# Patient Record
Sex: Female | Born: 1940 | Race: White | Hispanic: No | Smoking: Never smoker
Health system: Southern US, Community
[De-identification: ages and names within clinical notes are randomized; demographics above are authoritative.]

## PROBLEM LIST (undated history)

## (undated) DIAGNOSIS — M810 Age-related osteoporosis without current pathological fracture: Secondary | ICD-10-CM

## (undated) DIAGNOSIS — M1990 Unspecified osteoarthritis, unspecified site: Secondary | ICD-10-CM

## (undated) DIAGNOSIS — F0390 Unspecified dementia without behavioral disturbance: Secondary | ICD-10-CM

## (undated) DIAGNOSIS — I10 Essential (primary) hypertension: Secondary | ICD-10-CM

## (undated) DIAGNOSIS — D509 Iron deficiency anemia, unspecified: Secondary | ICD-10-CM

## (undated) HISTORY — PX: KNEE SURGERY: SHX244

---

## 1999-07-12 HISTORY — PX: OTHER SURGICAL HISTORY: SHX169

## 2003-11-15 ENCOUNTER — Ambulatory Visit: Payer: Self-pay | Admitting: Family Medicine

## 2004-07-17 ENCOUNTER — Ambulatory Visit: Payer: Self-pay | Admitting: General Practice

## 2004-07-17 HISTORY — PX: OTHER SURGICAL HISTORY: SHX169

## 2005-02-18 ENCOUNTER — Ambulatory Visit: Payer: Self-pay | Admitting: Family Medicine

## 2005-08-13 ENCOUNTER — Ambulatory Visit: Payer: Self-pay | Admitting: Family Medicine

## 2006-05-01 ENCOUNTER — Ambulatory Visit: Payer: Self-pay | Admitting: Family Medicine

## 2007-05-21 ENCOUNTER — Ambulatory Visit: Payer: Self-pay | Admitting: Family Medicine

## 2008-08-18 ENCOUNTER — Ambulatory Visit: Payer: Self-pay | Admitting: Family Medicine

## 2008-09-15 ENCOUNTER — Encounter: Admission: RE | Admit: 2008-09-15 | Discharge: 2008-09-15 | Payer: Self-pay | Admitting: Neurology

## 2009-11-28 ENCOUNTER — Ambulatory Visit: Payer: Self-pay | Admitting: Family Medicine

## 2010-08-15 ENCOUNTER — Ambulatory Visit: Payer: Self-pay

## 2010-09-01 ENCOUNTER — Emergency Department: Payer: Self-pay | Admitting: Internal Medicine

## 2010-09-25 ENCOUNTER — Ambulatory Visit: Payer: Self-pay | Admitting: Unknown Physician Specialty

## 2010-10-02 ENCOUNTER — Inpatient Hospital Stay: Payer: Self-pay | Admitting: Unknown Physician Specialty

## 2010-10-02 HISTORY — PX: OTHER SURGICAL HISTORY: SHX169

## 2011-01-31 ENCOUNTER — Ambulatory Visit: Payer: Self-pay | Admitting: Family Medicine

## 2011-09-02 ENCOUNTER — Ambulatory Visit: Payer: Self-pay | Admitting: Gastroenterology

## 2011-12-06 ENCOUNTER — Ambulatory Visit: Payer: Self-pay | Admitting: Neurology

## 2011-12-29 ENCOUNTER — Ambulatory Visit: Payer: Self-pay | Admitting: Neurology

## 2012-02-03 ENCOUNTER — Ambulatory Visit: Payer: Self-pay | Admitting: Family Medicine

## 2014-01-17 ENCOUNTER — Ambulatory Visit: Payer: Self-pay | Admitting: Surgery

## 2014-01-17 LAB — HEMOGLOBIN: HGB: 11.5 g/dL — ABNORMAL LOW (ref 12.0–16.0)

## 2014-01-18 ENCOUNTER — Ambulatory Visit: Payer: Self-pay | Admitting: Surgery

## 2014-01-18 HISTORY — PX: OTHER SURGICAL HISTORY: SHX169

## 2014-06-01 NOTE — Op Note (Signed)
PATIENT NAME:  Katie Fitzgerald, Katie Fitzgerald MR#:  [id number] DATE OF BIRTH:  01/30/1941  DATE OF PROCEDURE:  01/18/2014  PREOPERATIVE DIAGNOSIS:  Closed comminuted left olecranon fracture.  POSTOPERATIVE DIAGNOSIS:  Closed comminuted left olecranon fracture.  PROCEDURE PERFORMED:  Open reduction and internal fixation of the left olecranon fracture.   SURGEON:  Maryagnes Amos, MD.  ASSISTANT:  April Berndt, NP.  ANESTHESIA:  General LMA.  FINDINGS:  As noted above.  COMPLICATIONS:  None.  ESTIMATED BLOOD LOSS:  Minimal.  TOTAL FLUIDS:  700 mL of crystalloid.  TOURNIQUET TIME:  61 minutes at 250 mmHg.  DRAINS:  None.  CLOSURE:  Staples.  BRIEF CLINICAL NOTE:  The patient is a 74 year old female who sustained the above-noted injury last week, when she fell onto her flexed elbow. She presented to an urgent care facility, where x-rays confirmed the above-noted findings. She presents today for definitive management of her injury.   PROCEDURE IN DETAIL:  The patient was brought into the operating room and lain in the supine position. After adequate general laryngeal mask anesthesia was obtained, the patient's left upper extremity was prepped with ChloraPrep solution before being draped sterilely. Preoperative antibiotics were administered. The limb was exsanguinated with an Esmarch and the sterile tourniquet inflated 250 mmHg. A curvilinear incision was made over the posterior aspect of the elbow with care taken to avoid making the incision directly over the posterior olecranon. The incision was carried down through the subcutaneous tissues to expose the fracture site. Dissection was carried out proximally and distally sufficiently to expose the fracture. The fracture hematoma was debrided thoroughly using irrigation, curette, and pickups. A single large fragment was encountered and saved, as it incorporated an area of articular cartilage. Once the fracture was debrided of hematoma, it was reduced and  temporarily secured using the bone tenaculum. The fracture was closed so as to incorporate, in a secure fashion, the articular fragment. The adequacy of reduction was verified fluoroscopically in AP and lateral projections and found to be excellent. The short Biomet precontoured olecranon locking late was applied to the posterior aspect of the proximal ulna and temporarily secured using pins. The adequacy of reduction and hardware position was verified using fluoroscan imaging.  An adjustment was required before the hardware was felt to be in excellent position. The adequacy of reduction remained excellent. The plate was secured using two locking screws proximally and a compression screw distally. The temporary stabilizing pins were removed prior to compressing the fracture. The "homerun" screw was inserted and a repeat fluoroscopic imaging obtained to be sure that the hardware position and fracture alignment remained excellent. This was verified. Two additional locking screws were placed distally to complete the fracture fixation.  The wound was copiously irrigated with sterile saline solution before the wound was closed using #0 Vicryl interrupted sutures for the deeper subcutaneous tissues and 2-0 Vicryl interrupted sutures for the subcuticular layer.  The skin was closed using staples.  A total of 10 mL of 0.5% Marcaine was injected in and around the incision to help with postoperative analgesia. A sterile bulky dressing was applied to the wound before the patient was placed into a posterior long-arm splint with the elbow at approximately 75-80 degrees of flexion and in neutral rotation. The patient was then awakened, extubated, and returned to the recovery room in satisfactory condition after tolerating the procedure well.    ____________________________ J. Derald Macleod, MD jjp:mw D: 01/18/2014 17:11:44 ET T: 01/18/2014 18:56:25 ET JOB#: [id number]  cc:  Maryagnes AmosJ. Jeffrey Poggi, MD, <Dictator> JEFF Fidel LevyJ POGGI  MD ELECTRONICALLY SIGNED 02/15/2014 11:26

## 2014-10-18 ENCOUNTER — Other Ambulatory Visit: Payer: Self-pay | Admitting: Family Medicine

## 2014-10-18 DIAGNOSIS — Z1231 Encounter for screening mammogram for malignant neoplasm of breast: Secondary | ICD-10-CM

## 2014-10-26 ENCOUNTER — Other Ambulatory Visit: Payer: Self-pay | Admitting: Family Medicine

## 2014-10-26 ENCOUNTER — Ambulatory Visit
Admission: RE | Admit: 2014-10-26 | Discharge: 2014-10-26 | Disposition: A | Discharge status: Home / Self Care | Payer: Medicare PPO | Source: Ambulatory Visit | Attending: Family Medicine | Admitting: Family Medicine

## 2014-10-26 DIAGNOSIS — Z1231 Encounter for screening mammogram for malignant neoplasm of breast: Secondary | ICD-10-CM | POA: Diagnosis present

## 2015-04-29 ENCOUNTER — Observation Stay
Admission: EM | Admit: 2015-04-29 | Discharge: 2015-05-01 | Disposition: A | Discharge status: Skilled Nursing Facility | Payer: Medicare Other | Attending: Internal Medicine | Admitting: Internal Medicine

## 2015-04-29 ENCOUNTER — Emergency Department: Payer: Medicare Other

## 2015-04-29 ENCOUNTER — Observation Stay: Payer: Medicare Other

## 2015-04-29 ENCOUNTER — Encounter: Payer: Self-pay | Admitting: Emergency Medicine

## 2015-04-29 DIAGNOSIS — R413 Other amnesia: Secondary | ICD-10-CM | POA: Insufficient documentation

## 2015-04-29 DIAGNOSIS — E860 Dehydration: Secondary | ICD-10-CM | POA: Diagnosis not present

## 2015-04-29 DIAGNOSIS — M25452 Effusion, left hip: Secondary | ICD-10-CM | POA: Diagnosis not present

## 2015-04-29 DIAGNOSIS — Z981 Arthrodesis status: Secondary | ICD-10-CM | POA: Insufficient documentation

## 2015-04-29 DIAGNOSIS — Z818 Family history of other mental and behavioral disorders: Secondary | ICD-10-CM | POA: Insufficient documentation

## 2015-04-29 DIAGNOSIS — Z823 Family history of stroke: Secondary | ICD-10-CM | POA: Insufficient documentation

## 2015-04-29 DIAGNOSIS — W010XXA Fall on same level from slipping, tripping and stumbling without subsequent striking against object, initial encounter: Secondary | ICD-10-CM | POA: Insufficient documentation

## 2015-04-29 DIAGNOSIS — I10 Essential (primary) hypertension: Secondary | ICD-10-CM | POA: Insufficient documentation

## 2015-04-29 DIAGNOSIS — S59902A Unspecified injury of left elbow, initial encounter: Secondary | ICD-10-CM | POA: Diagnosis not present

## 2015-04-29 DIAGNOSIS — Z9889 Other specified postprocedural states: Secondary | ICD-10-CM | POA: Insufficient documentation

## 2015-04-29 DIAGNOSIS — Z23 Encounter for immunization: Secondary | ICD-10-CM | POA: Insufficient documentation

## 2015-04-29 DIAGNOSIS — M25522 Pain in left elbow: Secondary | ICD-10-CM | POA: Insufficient documentation

## 2015-04-29 DIAGNOSIS — R55 Syncope and collapse: Secondary | ICD-10-CM | POA: Diagnosis present

## 2015-04-29 DIAGNOSIS — Z79899 Other long term (current) drug therapy: Secondary | ICD-10-CM | POA: Diagnosis not present

## 2015-04-29 DIAGNOSIS — M1990 Unspecified osteoarthritis, unspecified site: Secondary | ICD-10-CM | POA: Insufficient documentation

## 2015-04-29 DIAGNOSIS — S32592A Other specified fracture of left pubis, initial encounter for closed fracture: Secondary | ICD-10-CM | POA: Diagnosis not present

## 2015-04-29 DIAGNOSIS — M25552 Pain in left hip: Secondary | ICD-10-CM | POA: Diagnosis not present

## 2015-04-29 DIAGNOSIS — I6522 Occlusion and stenosis of left carotid artery: Secondary | ICD-10-CM | POA: Diagnosis not present

## 2015-04-29 DIAGNOSIS — Z880 Allergy status to penicillin: Secondary | ICD-10-CM | POA: Diagnosis not present

## 2015-04-29 DIAGNOSIS — F0390 Unspecified dementia without behavioral disturbance: Secondary | ICD-10-CM | POA: Diagnosis not present

## 2015-04-29 HISTORY — DX: Unspecified osteoarthritis, unspecified site: M19.90

## 2015-04-29 HISTORY — DX: Essential (primary) hypertension: I10

## 2015-04-29 HISTORY — DX: Unspecified dementia, unspecified severity, without behavioral disturbance, psychotic disturbance, mood disturbance, and anxiety: F03.90

## 2015-04-29 LAB — CBC WITH DIFFERENTIAL/PLATELET
BASOS ABS: 0.1 10*3/uL (ref 0–0.1)
BASOS PCT: 1 %
Eosinophils Absolute: 0.1 10*3/uL (ref 0–0.7)
Eosinophils Relative: 1 %
HEMATOCRIT: 37 % (ref 35.0–47.0)
HEMOGLOBIN: 12.4 g/dL (ref 12.0–16.0)
LYMPHS PCT: 8 %
Lymphs Abs: 0.8 10*3/uL — ABNORMAL LOW (ref 1.0–3.6)
MCH: 29.9 pg (ref 26.0–34.0)
MCHC: 33.6 g/dL (ref 32.0–36.0)
MCV: 89 fL (ref 80.0–100.0)
Monocytes Absolute: 0.7 10*3/uL (ref 0.2–0.9)
Monocytes Relative: 6 %
NEUTROS ABS: 8.7 10*3/uL — AB (ref 1.4–6.5)
NEUTROS PCT: 84 %
Platelets: 238 10*3/uL (ref 150–440)
RBC: 4.16 MIL/uL (ref 3.80–5.20)
RDW: 13.6 % (ref 11.5–14.5)
WBC: 10.3 10*3/uL (ref 3.6–11.0)

## 2015-04-29 LAB — URINALYSIS COMPLETE WITH MICROSCOPIC (ARMC ONLY)
BILIRUBIN URINE: NEGATIVE
Bacteria, UA: NONE SEEN
Glucose, UA: NEGATIVE mg/dL
Leukocytes, UA: NEGATIVE
Nitrite: NEGATIVE
PH: 6 (ref 5.0–8.0)
PROTEIN: NEGATIVE mg/dL
Specific Gravity, Urine: 1.02 (ref 1.005–1.030)

## 2015-04-29 LAB — BASIC METABOLIC PANEL
ANION GAP: 3 — AB (ref 5–15)
BUN: 24 mg/dL — ABNORMAL HIGH (ref 6–20)
CALCIUM: 8.5 mg/dL — AB (ref 8.9–10.3)
CO2: 25 mmol/L (ref 22–32)
Chloride: 110 mmol/L (ref 101–111)
Creatinine, Ser: 0.9 mg/dL (ref 0.44–1.00)
Glucose, Bld: 109 mg/dL — ABNORMAL HIGH (ref 65–99)
POTASSIUM: 3.8 mmol/L (ref 3.5–5.1)
Sodium: 138 mmol/L (ref 135–145)

## 2015-04-29 LAB — TROPONIN I

## 2015-04-29 LAB — MAGNESIUM: MAGNESIUM: 2.3 mg/dL (ref 1.7–2.4)

## 2015-04-29 MED ORDER — METHYLPREDNISOLONE SODIUM SUCC 125 MG IJ SOLR
125.0000 mg | Freq: Once | INTRAMUSCULAR | Status: AC
  Administered 2015-04-29: 125 mg via INTRAVENOUS
  Filled 2015-04-29: qty 2

## 2015-04-29 MED ORDER — SENNOSIDES-DOCUSATE SODIUM 8.6-50 MG PO TABS: 1.0000 | ORAL_TABLET | Freq: Every evening | ORAL | Status: DC | PRN

## 2015-04-29 MED ORDER — SODIUM CHLORIDE 0.9 % IV SOLN
INTRAVENOUS | Status: DC
  Administered 2015-04-29 – 2015-05-01 (×3): via INTRAVENOUS

## 2015-04-29 MED ORDER — ALBUTEROL SULFATE (2.5 MG/3ML) 0.083% IN NEBU: 2.5000 mg | INHALATION_SOLUTION | RESPIRATORY_TRACT | Status: DC | PRN

## 2015-04-29 MED ORDER — ACETAMINOPHEN 325 MG PO TABS: 650.0000 mg | ORAL_TABLET | Freq: Four times a day (QID) | ORAL | Status: DC | PRN

## 2015-04-29 MED ORDER — SODIUM CHLORIDE 0.9% FLUSH
3.0000 mL | Freq: Two times a day (BID) | INTRAVENOUS | Status: DC
  Administered 2015-05-01: 3 mL via INTRAVENOUS

## 2015-04-29 MED ORDER — CITALOPRAM HYDROBROMIDE 20 MG PO TABS
10.0000 mg | ORAL_TABLET | Freq: Every day | ORAL | Status: DC
  Administered 2015-04-29 – 2015-05-01 (×3): 10 mg via ORAL
  Filled 2015-04-29 (×3): qty 1

## 2015-04-29 MED ORDER — DOCUSATE SODIUM 100 MG PO CAPS
100.0000 mg | ORAL_CAPSULE | Freq: Two times a day (BID) | ORAL | Status: DC
  Administered 2015-04-29 – 2015-05-01 (×4): 100 mg via ORAL
  Filled 2015-04-29 (×4): qty 1

## 2015-04-29 MED ORDER — CITALOPRAM HYDROBROMIDE 20 MG PO TABS: 10.0000 mg | ORAL_TABLET | ORAL | Status: DC

## 2015-04-29 MED ORDER — ENOXAPARIN SODIUM 40 MG/0.4ML ~~LOC~~ SOLN
40.0000 mg | SUBCUTANEOUS | Status: DC
  Administered 2015-04-29 – 2015-04-30 (×2): 40 mg via SUBCUTANEOUS
  Filled 2015-04-29 (×2): qty 0.4

## 2015-04-29 MED ORDER — CITALOPRAM HYDROBROMIDE 20 MG PO TABS: 20.0000 mg | ORAL_TABLET | Freq: Every day | ORAL | Status: DC

## 2015-04-29 MED ORDER — LEVOFLOXACIN IN D5W 750 MG/150ML IV SOLN
750.0000 mg | Freq: Once | INTRAVENOUS | Status: DC
  Filled 2015-04-29: qty 150

## 2015-04-29 MED ORDER — INFLUENZA VAC SPLIT QUAD 0.5 ML IM SUSY
0.5000 mL | PREFILLED_SYRINGE | INTRAMUSCULAR | Status: AC
  Administered 2015-04-30: 0.5 mL via INTRAMUSCULAR
  Filled 2015-04-29: qty 0.5

## 2015-04-29 MED ORDER — ONDANSETRON HCL 4 MG/2ML IJ SOLN: 4.0000 mg | Freq: Four times a day (QID) | INTRAMUSCULAR | Status: DC | PRN

## 2015-04-29 MED ORDER — PNEUMOCOCCAL VAC POLYVALENT 25 MCG/0.5ML IJ INJ
0.5000 mL | INJECTION | INTRAMUSCULAR | Status: AC
  Administered 2015-04-30: 0.5 mL via INTRAMUSCULAR
  Filled 2015-04-29: qty 0.5

## 2015-04-29 MED ORDER — MORPHINE SULFATE (PF) 4 MG/ML IV SOLN: 4.0000 mg | INTRAVENOUS | Status: DC | PRN

## 2015-04-29 MED ORDER — BISACODYL 5 MG PO TBEC: 5.0000 mg | DELAYED_RELEASE_TABLET | Freq: Every day | ORAL | Status: DC | PRN

## 2015-04-29 MED ORDER — MEMANTINE HCL 10 MG PO TABS
10.0000 mg | ORAL_TABLET | Freq: Two times a day (BID) | ORAL | Status: DC
  Administered 2015-04-29 – 2015-05-01 (×4): 10 mg via ORAL
  Filled 2015-04-29 (×4): qty 1

## 2015-04-29 MED ORDER — ACETAMINOPHEN 650 MG RE SUPP: 650.0000 mg | Freq: Four times a day (QID) | RECTAL | Status: DC | PRN

## 2015-04-29 MED ORDER — IBUPROFEN 600 MG PO TABS
600.0000 mg | ORAL_TABLET | Freq: Once | ORAL | Status: AC
  Administered 2015-04-29: 600 mg via ORAL
  Filled 2015-04-29: qty 1

## 2015-04-29 MED ORDER — HYDROCODONE-ACETAMINOPHEN 5-325 MG PO TABS
1.0000 | ORAL_TABLET | ORAL | Status: DC | PRN
  Administered 2015-04-30 – 2015-05-01 (×2): 1 via ORAL
  Filled 2015-04-29 (×2): qty 1
  Filled 2015-04-29: qty 2

## 2015-04-29 MED ORDER — ONDANSETRON HCL 4 MG PO TABS: 4.0000 mg | ORAL_TABLET | Freq: Four times a day (QID) | ORAL | Status: DC | PRN

## 2015-04-29 NOTE — Progress Notes (Signed)
Received report from Nellie in ED. Patient on the way.

## 2015-04-29 NOTE — ED Provider Notes (Addendum)
[facility] Emergency Department Provider Note  ____________________________________________   I have reviewed the triage vital signs and the nursing notes.   HISTORY  Chief Complaint Fall and Loss of Consciousness    HPI Katie Fitzgerald is a 75 y.o. female who [place] her family. She has a history of dementia. She is under baseline according to family. Last night she reported the family that she had fallen. Noacute passed out or not. She does not believe she did. She has no clear recollection of the fall. This is not unusual for her. Patient is in no acute distress. She states she is not having any pain. She denies any chest pain or shortness of breath or nausea or vomiting. Apparently she was complaining of left hip pain to EMS but she states that is better. She states she is always has some discomfort off and on in her left elbow but that does not hurt now either.  History reviewed. No pertinent past medical history.  There are no active problems to display for this patient.   History reviewed. No pertinent past surgical history.  Current Outpatient Rx  Name  Route  Sig  Dispense  Refill  . citalopram (CELEXA) 20 MG tablet   Oral   Take 10-20 mg by mouth See admin instructions. Take 1/2 tablet ( ) by mouth daily for 1 week, then increase to 1 tablet by mouth every day.         . memantine (NAMENDA) 10 MG tablet   Oral   Take 10 mg by mouth 2 (two) times daily.      11     Allergies Amoxicillin and Penicillin g  History reviewed. No pertinent family history.  Social History Social History  Substance Use Topics  . Smoking status: Never Smoker   . Smokeless tobacco: Never Used  . Alcohol Use: No    Review of Systems, Per family and patient limited by patient dementia Constitutional: No fever/chills Eyes: No visual changes. ENT: No sore throat. No stiff neck no neck pain Cardiovascular: Denies chest  pain. Respiratory: Denies shortness of breath. Gastrointestinal:   no vomiting.  No diarrhea.  No constipation. Genitourinary: Negative for dysuria. Musculoskeletal: Negative lower extremity swelling Skin: Negative for rash. Neurological: Negative for headaches, focal weakness or numbness. 10-point ROS otherwise negative.  ____________________________________________   PHYSICAL EXAM:  VITAL SIGNS: ED Triage Vitals  Enc Vitals Group     BP 04/29/15 0924 128/61 mmHg     Pulse Rate 04/29/15 0925 77     Resp 04/29/15 0924 14     Temp 04/29/15 0929 97.6 F (36.4 C)     Temp Source 04/29/15 0929 Oral     SpO2 04/29/15 0925 100 %     Weight 04/29/15 0929 114 lb 6.7 oz (51.9 kg)     Height 04/29/15 0929  (1.651 m)     Head Cir --      Peak Flow --      Pain Score --      Pain Loc --      Pain Edu? --      Excl. in GC? --     Constitutional: Alert and orientedTo name and place unsure of the day. Well appearing and in no acute distress. Eyes: Conjunctivae are normal. PERRL. EOMI. Head: Atraumatic. Nose: No congestion/rhinnorhea. Mouth/Throat: Mucous membranes are moist.  Oropharynx non-erythematous. Neck: No stridor.   Nontender with no meningismus Cardiovascular: Normal rate, regular  rhythm. Grossly normal heart sounds.  Good peripheral circulation. Respiratory: Normal respiratory effort.  No retractions. Lungs CTAB. Abdominal: Soft and nontender. No distention. No guarding no rebound Back:  There is no focal tenderness or step off there is no midline tenderness there are no lesions noted. there is no CVA tenderness Musculoskeletal: No lower extremity tenderness. She has minimal tenderness to palpation in the left elbow but full range of motion and no evidence of acute injury aside from a slight abrasion. She has full range of motion of both hips with no complaint of pain. No foreshortening, good pulses.. No joint effusions, no DVT signs strong distal pulses no  edema Neurologic:  Normal speech and language. No gross focal neurologic deficits are appreciated.  Skin:  Skin is warm, dry and intact. No rash noted. Psychiatric: Mood and affect are normal. Speech and behavior are normal.  ____________________________________________   LABS (all labs ordered are listed, but only abnormal results are displayed)  Labs Reviewed  BASIC METABOLIC PANEL - Abnormal; Notable for the following:    Glucose, Bld 109 (*)    BUN 24 (*)    Calcium 8.5 (*)    Anion gap 3 (*)    All other components within normal limits  CBC WITH DIFFERENTIAL/PLATELET - Abnormal; Notable for the following:    Neutro Abs 8.7 (*)    Lymphs Abs 0.8 (*)    All other components within normal limits  TROPONIN I  URINALYSIS COMPLETEWITH MICROSCOPIC ([facility] ONLY)   ____________________________________________  EKG  I personally interpreted any EKGs ordered by me or triage Sinus rhythm rate 75 bpm no acute ST elevation or acute ST depression, PVCs noted.  RADIOLOGY  I reviewed any imaging ordered by me or triage that were performed during my shift and, if possible, patient and/or family made aware of any abnormal findings. ____________________________________________   PROCEDURES  Procedure(s) performed: None  Critical Care performed: None  ____________________________________________   INITIAL IMPRESSION / ASSESSMENT AND PLAN / ED COURSE  Pertinent labs & imaging results that were available during my care of the patient were reviewed by me and considered in my medical decision making (see chart for details).  Patient quite well-appearing at her neurologic baseline, initially she had no complaints although we try to walk her she did complain of hip pain. For this rebound obtain a CT scan of her head which was negative as well as x-ray of her left leg and elbow all of which are negative. We'll see if the patient and they will give her some Tylenol. If if she is unable to  walk we will attempt CT scan of that area.  Blood pressure 129/64, pulse 92, temperature 97.6 F (36.4 C), temperature source Oral, resp. rate 15, height 5\' 5"  (1.651 m), weight 114 lb 6.7 oz (51.9 kg), SpO2 98 %. ----------------------------------------- 1:46 PM on 04/29/2015 -----------------------------------------  The daughter arrived, she states that the patient fell last night and this morning she was unable to walk more than a foot or 2 with a complaint of severe pain in the left leg. Patient did not have a witnessed fall last night but today whether trying to transfer her to the bathroom, she passed out for perhaps a minute in the arms of the daughter. This is not so it was accompanied by seizure activity, she did not have any postictal period. After this event, they brought her here. The patient has no recollection of these issues. She hasn't a negative x-ray of the hip  and knee and I do not palpate a lot of discomfort but patient has significant dementia and is very hard to get a baseline read. There is no obvious neurologic deficit although it's possible that there is some weakness or she is diffusely weak and "bilateral lower extremities which is hard to know eyes in terms of acuity. We try to walk her, she states that she cannot walk because of pain in her left hip although she is not exactly sure that's where the pain is. She has good pulses distally at this time is no evidence of vascular compromise. When she is in the bed she is comfortable and seems to move that leg okay. Family states that she has no difficulty walking at baseline and walks the dog several times a day. This is therefore an acute change. Certainly not impossible that there is an occult CVA although CT was negative. Nonetheless we'll obtain a CT scan of the left hip to further evaluate for possible occult fracture. There is no evidence at this time of lower extremity injury of any significance but we will recheck. If I  cannot get the patient admitted at baseline, she may need admission for further evaluation of her syncope as well as her inability to ambulate.  Discussed with Dr. Hyacinth Meeker who will follow. ____________________________________________   FINAL CLINICAL IMPRESSION(S) / ED DIAGNOSES  Final diagnoses:  None      This chart was dictated using voice recognition software.  Despite best efforts to proofread,  errors can occur which can change meaning.     Jeanmarie Plant, MD 04/29/15 1610  Jeanmarie Plant, MD 04/29/15 1349  Jeanmarie Plant, MD 04/29/15 [phone]

## 2015-04-29 NOTE — ED Notes (Signed)
Patient transported to X-ray 

## 2015-04-29 NOTE — ED Notes (Signed)
Tried to ambulate pt w/ MD McShane.  Pt c/o of pain w/ ambulation in L hip.

## 2015-04-29 NOTE — ED Notes (Signed)
Attempted to call report.  Was told bed was just accepted and RN needed 15 min to look at pts chart

## 2015-04-29 NOTE — ED Notes (Addendum)
Pt has no c/o while resting in bed but complains of sharp pain to L hip when attempting to put weight on L leg. MD notified.

## 2015-04-29 NOTE — Care Management Obs Status (Signed)
MEDICARE OBSERVATION STATUS NOTIFICATION   Patient Details  Name: Katie Fitzgerald MRN: [id number] Date of Birth: 01/16/1941   Medicare Observation Status Notification Given:  Yes (syncope / daughter signed)    Caren MacadamMichelle Deyonna Fitzsimmons, RN 04/29/2015, 3:46 PM

## 2015-04-29 NOTE — ED Notes (Signed)
Pt to ED via EMS c/o fall resulting from syncopal episode. Pt has hx of dementia and states she does not remember what happened. Pt's daughter is on her way to ED. EMS reports pt had initially complained of L hip and arm pain, has had previous surgeries to arm. No obvious deformity or rotation/shortening noted at this time. Pt currently has no c/o pain.

## 2015-04-29 NOTE — H&P (Addendum)
[facility] [place] [facility]   PATIENT NAME: Katie Fitzgerald    MR#:  [id number]  DATE OF BIRTH:  05/12/1940  DATE OF ADMISSION:  04/29/2015  PRIMARY CARE PHYSICIAN: No primary care provider on file.   REQUESTING/REFERRING PHYSICIAN: Jeanmarie Plant, MD  CHIEF COMPLAINT:   Chief Complaint  Patient presents with  . Fall  . Loss of Consciousness  Fall and syncope today.  HISTORY OF PRESENT ILLNESS:  Zaeda Mcferran  is a 75 y.o. female with a known history of Hypertension, osteoarthritis and dementia. The patient is not a good historian. According to her daughter, patient fell last night and this morning. She is unable to walk more than one foot and complained of severe pain in the left leg. The patient's daughter tried to help her to walk this morning, the patient felt nauseous and passed out. The patient denies any symptoms. CAT scans show pubic ramus fracture. The patient's daughter said that the patient has high pain tolerance.  PAST MEDICAL HISTORY:   Past Medical History  Diagnosis Date  . Hypertension   . Osteoarthrosis   . Dementia     PAST SURGICAL HISTORY:   Past Surgical History  Procedure Laterality Date  . Posterior lumbar spine fusion one level    . Open reduction internal fixation of the left olecranon fracture Left   . Knee surgery      SOCIAL HISTORY:   Social History  Substance Use Topics  . Smoking status: Never Smoker   . Smokeless tobacco: Never Used  . Alcohol Use: No    FAMILY HISTORY:   Family History  Problem Relation Age of Onset  . Dementia Mother   . Stroke Father     DRUG ALLERGIES:   Allergies  Allergen Reactions  . Amoxicillin     Other reaction(s): Unknown  . Penicillin G     Other reaction(s): Unknown    REVIEW OF SYSTEMS:  CONSTITUTIONAL: No fever, fatigue or weakness. EYES: No blurred or double vision.  EARS, NOSE, AND THROAT: No tinnitus or ear pain.  RESPIRATORY: No cough,  shortness of breath, wheezing or hemoptysis.  CARDIOVASCULAR: No chest pain, orthopnea, edema.  GASTROINTESTINAL: No nausea, vomiting, diarrhea or abdominal pain.  GENITOURINARY: No dysuria, hematuria.  ENDOCRINE: No polyuria, nocturia,  HEMATOLOGY: No anemia, easy bruising or bleeding SKIN: No rash or lesion. MUSCULOSKELETAL: No joint pain or arthritis.   NEUROLOGIC: No tingling, numbness, weakness.  PSYCHIATRY: No anxiety or depression.   MEDICATIONS AT HOME:   Prior to Admission medications   Medication Sig Start Date End Date Taking? Authorizing Provider  citalopram (CELEXA) 20 MG tablet Take 10-20 mg by mouth See admin instructions. Take 1/2 tablet ( ) by mouth daily for 1 week, then increase to 1 tablet by mouth every day. 04/27/15  Yes Historical Provider, MD  memantine (NAMENDA) 10 MG tablet Take 10 mg by mouth 2 (two) times daily. 04/15/15  Yes Historical Provider, MD      VITAL SIGNS:  Blood pressure 129/64, pulse 77, temperature 97.6 F (36.4 C), temperature source Oral, resp. rate 15, height  (1.651 m), weight 51.9 kg (114 lb 6.7 oz), SpO2 94 %.  PHYSICAL EXAMINATION:  GENERAL:  75 y.o.-year-old patient lying in the bed with no acute distress.  Thin. EYES: Pupils equal, round, reactive to light and accommodation. No scleral icterus. Extraocular muscles intact.  HEENT: Head atraumatic, normocephalic. Oropharynx and nasopharynx clear.  NECK:  Supple, no jugular venous distention.  No thyroid enlargement, no tenderness.  LUNGS: Normal breath sounds bilaterally, no wheezing, rales,rhonchi or crepitation. No use of accessory muscles of respiration.  CARDIOVASCULAR: S1, S2 normal. No murmurs, rubs, or gallops.  ABDOMEN: Soft, nontender, nondistended. Bowel sounds present. No organomegaly or mass.  EXTREMITIES: No pedal edema, cyanosis, or clubbing.  NEUROLOGIC: Cranial nerves II through XII are intact. Muscle strength 3-4/5 in all extremities. Sensation intact. Gait not  checked.  PSYCHIATRIC: The patient is alert and oriented x 2.  SKIN: No obvious rash, lesion, or ulcer.   LABORATORY PANEL:   CBC  Recent Labs Lab 04/29/15 1007  WBC 10.3  HGB 12.4  HCT 37.0  PLT 238   ------------------------------------------------------------------------------------------------------------------  Chemistries   Recent Labs Lab 04/29/15 1007  NA 138  K 3.8  CL 110  CO2 25  GLUCOSE 109*  BUN 24*  CREATININE 0.90  CALCIUM 8.5*   ------------------------------------------------------------------------------------------------------------------  Cardiac Enzymes  Recent Labs Lab 04/29/15 1007  TROPONINI <0.03   ------------------------------------------------------------------------------------------------------------------  RADIOLOGY:  Dg Elbow Complete Left  04/29/2015  CLINICAL DATA:  75 year old female with left elbow pain after falling EXAM: LEFT ELBOW - COMPLETE 3+ VIEW COMPARISON:  Prior intraoperative radiographs of the left elbow 01/18/2014 FINDINGS: Perhaps trace elevation of the anterior fat pad suggesting the presence of a small elbow joint effusion. However, no definite fracture visualized. Surgical changes of prior operative repair of a proximal ulnar fracture with posterior buttress plate and screw construct. No evidence of hardware complication. IMPRESSION: 1. Perhaps small elbow joint effusion without definitive fracture. 2. Stable postsurgical changes of ORIF of a proximal ulnar fracture without evidence of hardware complication. Electronically Signed   By: Malachy Moan M.D.   On: 04/29/2015 11:46   Ct Head Wo Contrast  04/29/2015  CLINICAL DATA:  Fall resulting from syncopal episode. Dementia. Patient amnestic for event. EXAM: CT HEAD WITHOUT CONTRAST TECHNIQUE: Contiguous axial images were obtained from the base of the skull through the vertex without intravenous contrast. COMPARISON:  12/06/2011. FINDINGS: No evidence for acute  infarction, hemorrhage, mass lesion, hydrocephalus, or extra-axial fluid. Mild atrophy not unexpected for age. Hypoattenuation of white matter suggestive of chronic microvascular ischemic change. Calvarium intact. Mild vascular calcification. No sinus or mastoid air fluid level. IMPRESSION: Chronic changes as described. No skull fracture or intracranial hemorrhage. Electronically Signed   By: Elsie Stain M.D.   On: 04/29/2015 11:49   Ct Hip Left Wo Contrast  04/29/2015  CLINICAL DATA:  Status post fall 04/28/2015. Left hip pain. The patient is unable to bear weight. EXAM: CT OF THE LEFT HIP WITHOUT CONTRAST TECHNIQUE: Multidetector CT imaging of the left hip was performed according to the standard protocol. Multiplanar CT image reconstructions were also generated. COMPARISON:  Plain films of the left hip earlier today. FINDINGS: The patient has an acute fracture of the left inferior pubic ramus with mild fragment override medially and buckling of the cortex laterally. No other fracture is identified. The left hip is located. There is a small left hip joint effusion. Minimal degenerative change is present about the left hip. Infiltration of subcutaneous fat over the left greater trochanter is compatible with contusion. Imaged muscles appear normal. Imaged intrapelvic contents demonstrate a small volume of free pelvic fluid. IMPRESSION: Acute left inferior pubic ramus fracture. No other fracture is identified. Small left hip joint effusion. Small volume of free pelvic fluid of unknown etiology. Electronically Signed   By: Drusilla Kanner M.D.   On: 04/29/2015 14:15   Dg Knee  Complete 4 Views Left  04/29/2015  CLINICAL DATA:  LEFT posterior hip pain, LEFT elbow pain.  , fall EXAM: LEFT KNEE - COMPLETE 4+ VIEW COMPARISON:  None. FINDINGS: There is narrowing of the medial compartment with associated sclerosis and osteophytosis. No evidence of fracture dislocation. No joint effusion. IMPRESSION: 1. No acute  findings in the LEFT knee. 2. Osteoarthritis of the medial compartment. Electronically Signed   By: Genevive BiStewart  Edmunds M.D.   On: 04/29/2015 11:43   Dg Hip Unilat With Pelvis 2-3 Views Left  04/29/2015  CLINICAL DATA:  Pt to ED post fall, c/o left posterior hip pain, left elbow pain, previous left elbow injury post fall with repair 01/2014; h/o dementia. EXAM: DG HIP (WITH OR WITHOUT PELVIS) 2-3V LEFT COMPARISON:  None. FINDINGS: Hips are located. No evidence of pelvic fracture or sacral fracture. Dedicated view of the LEFT hip demonstrates no femoral neck fracture. Posterior lumbar fusion IMPRESSION: No pelvic fracture or LEFT hip fracture. Electronically Signed   By: Genevive BiStewart  Edmunds M.D.   On: 04/29/2015 11:42    EKG:   Orders placed or performed during the hospital encounter of 04/29/15  . ED EKG  . ED EKG  . EKG 12-Lead  . EKG 12-Lead    IMPRESSION AND PLAN:   Syncope, possible due to vasovagal Patient will be placed for observation. Continue telemetry monitor, get carotid duplex and echocardiogram. Fall precaution.  Acute left inferior pubic ramus fracture. Pain control, physical surgery and follow-up with Dr. Hyacinth MeekerMiller.  Dehydration. Give IV fluid support and follow-up BMP.  Hypertension. Controlled without hypertension medication.  Dementia. Aspiration and fall precaution.  All the records are reviewed and case discussed with ED provider. Management plans discussed with the patient, her daughter and they are in agreement.  CODE STATUS: full code.  TOTAL TIME TAKING CARE OF THIS PATIENT: 51 minutes.    Shaune Pollackhen, Izaac Reisig M.D on 04/29/2015 at 3:12 PM  Between 7am to 6pm - Pager - [phone]  After 6pm go to [email or web address] - password [email or web address] [facility]  [facility]  Office  [phone]  CC: Primary care physician; No primary care provider on file.

## 2015-04-30 ENCOUNTER — Observation Stay
Admit: 2015-04-30 | Discharge: 2015-04-30 | Disposition: A | Discharge status: Home / Self Care | Payer: Medicare Other | Attending: Internal Medicine | Admitting: Internal Medicine

## 2015-04-30 LAB — BASIC METABOLIC PANEL
Anion gap: 3 — ABNORMAL LOW (ref 5–15)
BUN: 24 mg/dL — AB (ref 6–20)
CALCIUM: 8.4 mg/dL — AB (ref 8.9–10.3)
CHLORIDE: 112 mmol/L — AB (ref 101–111)
CO2: 23 mmol/L (ref 22–32)
Creatinine, Ser: 0.61 mg/dL (ref 0.44–1.00)
GFR calc non Af Amer: >60 mL/min (ref >60)
Glucose, Bld: 115 mg/dL — ABNORMAL HIGH (ref 65–99)
Potassium: 3.7 mmol/L (ref 3.5–5.1)
SODIUM: 138 mmol/L (ref 135–145)

## 2015-04-30 LAB — CBC
HCT: 35.4 % (ref 35.0–47.0)
Hemoglobin: 11.9 g/dL — ABNORMAL LOW (ref 12.0–16.0)
MCH: 29.7 pg (ref 26.0–34.0)
MCHC: 33.6 g/dL (ref 32.0–36.0)
MCV: 88.2 fL (ref 80.0–100.0)
PLATELETS: 241 10*3/uL (ref 150–440)
RBC: 4.01 MIL/uL (ref 3.80–5.20)
RDW: 13.4 % (ref 11.5–14.5)
WBC: 13.5 10*3/uL — AB (ref 3.6–11.0)

## 2015-04-30 MED ORDER — MORPHINE SULFATE (PF) 2 MG/ML IV SOLN: 2.0000 mg | INTRAVENOUS | Status: DC | PRN

## 2015-04-30 MED ORDER — LORAZEPAM 2 MG/ML IJ SOLN
0.2500 mg | Freq: Once | INTRAMUSCULAR | Status: AC
  Administered 2015-04-30: 0.25 mg via INTRAVENOUS
  Filled 2015-04-30: qty 1

## 2015-04-30 NOTE — Progress Notes (Signed)
Spoke with Dr. Anne HahnWillis concerning pt anxiety and daughter request for medication. MD to place order. Pt impulsive at this time attempting to get out of bed. Tearful and searing for her mother. Called for low bed for safety of patient.

## 2015-04-30 NOTE — Evaluation (Signed)
Physical Therapy Evaluation Patient Details Name: Katie MainsDianne V Fitzgerald MRN: [id number] DOB: 06/30/1940 Today's Date: 04/30/2015   History of Present Illness  75 yo female with history of dementia [facility] after a fall with L hip pain from LOC.  Has L inferior pubic ramus fracture with WBAT.  PMHx:  lumbar fusion and dementia,  Clinical Impression  Pt is following instructions on a verbal and tactile cue combination but not unless PT is specific and persistent. Apparently was a furniture walker with limited willingness to add a cane for safety.  Dementia may preclude use of AD effectively but will request inpt therapy to reinforce her safety and balance training.    Follow Up Recommendations SNF    Equipment Recommendations  Other (comment) (await SNF disposition )    Recommendations for Other Services Rehab consult     Precautions / Restrictions Precautions Precautions: Fall (telemetry) Restrictions Weight Bearing Restrictions: No      Mobility  Bed Mobility Overal bed mobility: Needs Assistance Bed Mobility: Supine to Sit;Sit to Supine     Supine to sit: Min assist;Mod assist Sit to supine: Min assist;Mod assist   General bed mobility comments: Pt is struggling to sequence and know how to move due to L shoulder and hip pain  Transfers Overall transfer level: Needs assistance Equipment used: Rolling walker (2 wheeled);1 person hand held assist Transfers: Sit to/from [employer]Stand;Stand Pivot Transfers Sit to Stand: Min assist Stand pivot transfers: Min assist       General transfer comment: pt is needing hand over hand cues at times to maintain her hands on the walker, and forgets instructions quickly, sets walker aside but then cannot walk  Ambulation/Gait Ambulation/Gait assistance: Min assist (dense cues for all) Ambulation Distance (Feet): 12 Feet Assistive device: Rolling walker (2 wheeled);1 person hand held assist (dense cues)   Gait velocity: slow halting  pace Gait velocity interpretation: Below normal speed for age/gender General Gait Details: Pt is walking with short choppy steps due to pain on L hip with limited LLE WBing tolerated, but also sets walker aside or lets go often with hands.  Dense verbal and tactile cues needed   Stairs            Wheelchair Mobility    Modified Rankin (Stroke Patients Only)       Balance Overall balance assessment: History of Falls                                           Pertinent Vitals/Pain Pain Assessment: Faces Faces Pain Scale: Hurts little more Pain Location: L hip and shoulder Pain Descriptors / Indicators: Aching;Guarding Pain Intervention(s): Limited activity within patient's tolerance;Monitored during session;RN gave pain meds during session;Repositioned    Home Living Family/patient expects to be discharged to:: Private residence Living Arrangements: Alone;Other (Comment) (children have her in a Mother in law house but not with her ) Available Help at Discharge: Family;Available PRN/intermittently Type of Home: House Home Access: Level entry     Home Layout: One level Home Equipment: Walker - 2 wheels;Cane - single point;Shower seat      Prior Function Level of Independence: Independent (walks on the furniture per daughter in law)         Comments: Pt lives with daughter in house next to mother in law house     Hand Dominance   Dominant Hand: Right  Extremity/Trunk Assessment   Upper Extremity Assessment: Overall WFL for tasks assessed           Lower Extremity Assessment: LLE deficits/detail   LLE Deficits / Details: L hip pain due to fracture  Cervical / Trunk Assessment: Kyphotic  Communication   Communication: No difficulties (confusion)  Cognition Arousal/Alertness: Awake/alert Behavior During Therapy: WFL for tasks assessed/performed Overall Cognitive Status: History of cognitive impairments - at baseline       Memory:  Decreased recall of precautions;Decreased short-term memory (asked PT her name multiple times)              General Comments General comments (skin integrity, edema, etc.): Pt is demonstrating limited willingness to use AD but needs to do so or at least practice balance to limit her need for AD    Exercises        Assessment/Plan    PT Assessment Patient needs continued PT services  PT Diagnosis Acute pain;Difficulty walking;Altered mental status   PT Problem List Decreased strength;Decreased range of motion;Decreased activity tolerance;Decreased balance;Decreased mobility;Decreased coordination;Decreased cognition;Decreased knowledge of use of DME;Decreased safety awareness;Cardiopulmonary status limiting activity;Decreased knowledge of precautions;Pain  PT Treatment Interventions DME instruction;Gait training;Functional mobility training;Therapeutic activities;Therapeutic exercise;Neuromuscular re-education;Balance training;Patient/family education;Cognitive remediation   PT Goals (Current goals can be found in the Care Plan section) Acute Rehab PT Goals Patient Stated Goal: to get to the bathroom PT Goal Formulation: With patient/family Time For Goal Achievement: 05/14/15 Potential to Achieve Goals: Good    Frequency Min 2X/week   Barriers to discharge Decreased caregiver support (in separate living quarters)      Co-evaluation               End of Session Equipment Utilized During Treatment: Gait belt Activity Tolerance: Patient tolerated treatment well;Patient limited by pain;Other (comment) (dementia) Patient left: in bed;with call bell/phone within reach;with bed alarm set;with family/visitor present;with nursing/sitter in room Nurse Communication: Mobility status    Functional Assessment Tool Used: clinical judgment Functional Limitation: Mobility: Walking and moving around Mobility: Walking and Moving Around Goal Status [phone]): At least 20 percent but less  than 40 percent impaired, limited or restricted Mobility: Walking and Moving Around Discharge Status [phone]): At least 20 percent but less than 40 percent impaired, limited or restricted    Time: 0929-1009 PT Time Calculation (min) (ACUTE ONLY): 40 min   Charges:   PT Evaluation $PT Eval Moderate Complexity: 1 Procedure PT Treatments $Gait Training: 8-22 mins $Therapeutic Activity: 8-22 mins   PT G Codes:   PT G-Codes **NOT FOR INPATIENT CLASS** Functional Assessment Tool Used: clinical judgment Functional Limitation: Mobility: Walking and moving around Mobility: Walking and Moving Around Goal Status [phone]): At least 20 percent but less than 40 percent impaired, limited or restricted Mobility: Walking and Moving Around Discharge Status [phone]): At least 20 percent but less than 40 percent impaired, limited or restricted    Ivar Drape 04/30/2015, 11:41 AM   Samul Dada, PT MS Acute Rehab Dept. Number: [facility] [id number] and [facility] [phone]

## 2015-04-30 NOTE — Care Management Note (Signed)
Case Management Note  Patient Details  Name: Katie MainsDianne V Fitzgerald MRN: [id number] Date of Birth: 12/26/1940  Subjective/Objective:         74yo OBSERVATION status Katie Fitzgerald was admitted 04/29/15 with a pubic ramus fracture after multiple falls at home. Severe dementia. However,reportedly  resides in a small dwelling alone bedside her daughters home. [facility]-PT is  recommending SNF. Ortho consult pending.            Action/Plan:   Expected Discharge Date:                  Expected Discharge Plan:     In-House Referral:     Discharge planning Services     Post Acute Care Choice:    Choice offered to:     DME Arranged:    DME Agency:     HH Arranged:    HH Agency:     Status of Service:     Medicare Important Message Given:    Date Medicare IM Given:    Medicare IM give by:    Date Additional Medicare IM Given:    Additional Medicare Important Message give by:     If discussed at Long Length of Stay Meetings, dates discussed:    Additional Comments:  Yovany Clock A, RN 04/30/2015, 4:41 PM

## 2015-04-30 NOTE — Consult Note (Signed)
ORTHOPAEDIC CONSULTATION  REQUESTING PHYSICIAN: Wyatt Haste, MD  Chief Complaint: Left hip pain  HPI: Katie Fitzgerald is a 75 y.o. female who complains of  Left hip pain following a fall at home yesterday.  Tripped over her dog/  Brought to ER where exam and xrays/CT scan show a non displaced left inferior pubic ramus fx.  Admitted for care and PT.  Lives alone  Past Medical History  Diagnosis Date  . Hypertension   . Osteoarthrosis   . Dementia    Past Surgical History  Procedure Laterality Date  . Posterior lumbar spine fusion one level    . Open reduction internal fixation of the left olecranon fracture Left   . Knee surgery     Social History   Social History  . Marital Status: Divorced    Spouse Name: N/A  . Number of Children: N/A  . Years of Education: N/A   Social History Main Topics  . Smoking status: Never Smoker   . Smokeless tobacco: Never Used  . Alcohol Use: No  . Drug Use: No  . Sexual Activity: Not Asked   Other Topics Concern  . None   Social History Narrative   Family History  Problem Relation Age of Onset  . Dementia Mother   . Stroke Father    Allergies  Allergen Reactions  . Amoxicillin     Other reaction(s): Unknown  . Penicillin G     Other reaction(s): Unknown   Prior to Admission medications   Medication Sig Start Date End Date Taking? Authorizing Provider  citalopram (CELEXA) 20 MG tablet Take 10-20 mg by mouth See admin instructions. Take 1/2 tablet ( ) by mouth daily for 1 week, then increase to 1 tablet by mouth every day. 04/27/15  Yes Historical Provider, MD  memantine (NAMENDA) 10 MG tablet Take 10 mg by mouth 2 (two) times daily. 04/15/15  Yes Historical Provider, MD   Dg Elbow Complete Left  04/29/2015  CLINICAL DATA:  75 year old female with left elbow pain after falling EXAM: LEFT ELBOW - COMPLETE 3+ VIEW COMPARISON:  Prior intraoperative radiographs of the left elbow 01/18/2014 FINDINGS: Perhaps trace elevation of  the anterior fat pad suggesting the presence of a small elbow joint effusion. However, no definite fracture visualized. Surgical changes of prior operative repair of a proximal ulnar fracture with posterior buttress plate and screw construct. No evidence of hardware complication. IMPRESSION: 1. Perhaps small elbow joint effusion without definitive fracture. 2. Stable postsurgical changes of ORIF of a proximal ulnar fracture without evidence of hardware complication. Electronically Signed   By: Malachy Moan M.D.   On: 04/29/2015 11:46   Ct Head Wo Contrast  04/29/2015  CLINICAL DATA:  Fall resulting from syncopal episode. Dementia. Patient amnestic for event. EXAM: CT HEAD WITHOUT CONTRAST TECHNIQUE: Contiguous axial images were obtained from the base of the skull through the vertex without intravenous contrast. COMPARISON:  12/06/2011. FINDINGS: No evidence for acute infarction, hemorrhage, mass lesion, hydrocephalus, or extra-axial fluid. Mild atrophy not unexpected for age. Hypoattenuation of white matter suggestive of chronic microvascular ischemic change. Calvarium intact. Mild vascular calcification. No sinus or mastoid air fluid level. IMPRESSION: Chronic changes as described. No skull fracture or intracranial hemorrhage. Electronically Signed   By: Elsie Stain M.D.   On: 04/29/2015 11:49   Ct Hip Left Wo Contrast  04/29/2015  CLINICAL DATA:  Status post fall 04/28/2015. Left hip pain. The patient is unable to bear weight. EXAM: CT OF THE LEFT  HIP WITHOUT CONTRAST TECHNIQUE: Multidetector CT imaging of the left hip was performed according to the standard protocol. Multiplanar CT image reconstructions were also generated. COMPARISON:  Plain films of the left hip earlier today. FINDINGS: The patient has an acute fracture of the left inferior pubic ramus with mild fragment override medially and buckling of the cortex laterally. No other fracture is identified. The left hip is located. There is a  small left hip joint effusion. Minimal degenerative change is present about the left hip. Infiltration of subcutaneous fat over the left greater trochanter is compatible with contusion. Imaged muscles appear normal. Imaged intrapelvic contents demonstrate a small volume of free pelvic fluid. IMPRESSION: Acute left inferior pubic ramus fracture. No other fracture is identified. Small left hip joint effusion. Small volume of free pelvic fluid of unknown etiology. Electronically Signed   By: Drusilla Kanner M.D.   On: 04/29/2015 14:15   US Carotid Bilateral  04/29/2015  CLINICAL DATA:  Syncopal episodes EXAM: BILATERAL CAROTID DUPLEX ULTRASOUND TECHNIQUE: Wallace Cullens scale imaging, color Doppler and duplex ultrasound were performed of bilateral carotid and vertebral arteries in the neck. COMPARISON:  None. FINDINGS: Criteria: Quantification of carotid stenosis is based on velocity parameters that correlate the residual internal carotid diameter with NASCET-based stenosis levels, using the diameter of the distal internal carotid lumen as the denominator for stenosis measurement. The following velocity measurements were obtained: RIGHT ICA:  77/13 cm/sec CCA:  111/13 cm/sec SYSTOLIC ICA/CCA RATIO:  0.68 DIASTOLIC ICA/CCA RATIO:  1 ECA:  198 cm/sec LEFT ICA:  164/27 cm/sec CCA:  118/14 cm/sec SYSTOLIC ICA/CCA RATIO:  0.4 DIASTOLIC ICA/CCA RATIO:  2.0 ECA:  165 cm/sec RIGHT CAROTID ARTERY: Preliminary grayscale images demonstrate atherosclerotic plaque in the region of the carotid bulb and proximal internal carotid artery. The waveforms, velocities and flow velocity ratios however demonstrate no evidence of focal hemodynamically significant stenosis. RIGHT VERTEBRAL ARTERY:  Antegrade in nature. LEFT CAROTID ARTERY: Primary grayscale images demonstrate atherosclerotic plaque in the carotid bulb and proximal internal carotid artery. The waveforms, velocities and flow velocity ratios are consistent with a stenosis in the 50-69%  range. LEFT VERTEBRAL ARTERY:  Antegrade in nature. IMPRESSION: No focal stenosis on the right. 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 Electronically Signed   By: Alcide Clever M.D.   On: 04/29/2015 18:20   Dg Knee Complete 4 Views Left  04/29/2015  CLINICAL DATA:  LEFT posterior hip pain, LEFT elbow pain.  , fall EXAM: LEFT KNEE - COMPLETE 4+ VIEW COMPARISON:  None. FINDINGS: There is narrowing of the medial compartment with associated sclerosis and osteophytosis. No evidence of fracture dislocation. No joint effusion. IMPRESSION: 1. No acute findings in the LEFT knee. 2. Osteoarthritis of the medial compartment. Electronically Signed   By: Genevive Bi M.D.   On: 04/29/2015 11:43   Dg Hip Unilat With Pelvis 2-3 Views Left  04/29/2015  CLINICAL DATA:  Pt to ED post fall, c/o left posterior hip pain, left elbow pain, previous left elbow injury post fall with repair 01/2014; h/o dementia. EXAM: DG HIP (WITH OR WITHOUT PELVIS) 2-3V LEFT COMPARISON:  None. FINDINGS: Hips are located. No evidence of pelvic fracture or sacral fracture. Dedicated view of the LEFT hip demonstrates no femoral neck fracture. Posterior lumbar fusion IMPRESSION: No pelvic fracture or LEFT hip fracture. Electronically Signed   By: Genevive Bi M.D.   On: 04/29/2015 11:42    Positive ROS: All other systems have been reviewed and were otherwise negative with  the exception of those mentioned in the HPI and as above.  Physical Exam: General: Alert, no acute distress Cardiovascular: No pedal edema Respiratory: No cyanosis, no use of accessory musculature GI: No organomegaly, abdomen is soft and non-tender Skin: No lesions in the area of chief complaint Neurologic: Sensation intact distally Psychiatric: Patient is competent for consent with normal mood and affect Lymphatic: No axillary or cervical lymphadenopathy  MUSCULOSKELETAL: Mild pain with rom left leg.  Pubix slightly tender.  csm good.  Leg elngths  normal.    Assessment: Non displaced left pubic ramus fx  Plan: PT WBAT with walker on left SNF for short term RTC 2 weeks for Carilyn Goodpasturexrays    Whittany Parish E, MD [phone]   04/30/2015 6:24 PM

## 2015-04-30 NOTE — Progress Notes (Signed)
[facility] [place] [facility]   PATIENT NAME: Katie Fitzgerald    MR#:  [id number]  DATE OF BIRTH:  1940/11/25  SUBJECTIVE:  Baseline dementia, lives alone, multiple recent falls Complains of hip pain with movement  REVIEW OF SYSTEMS:  CONSTITUTIONAL: No fever, fatigue or weakness.  EYES: No blurred or double vision.  EARS, NOSE, AND THROAT: No tinnitus or ear pain.  RESPIRATORY: No cough, shortness of breath, wheezing or hemoptysis.  CARDIOVASCULAR: No chest pain, orthopnea, edema.  GASTROINTESTINAL: No nausea, vomiting, diarrhea or abdominal pain.  GENITOURINARY: No dysuria, hematuria.  ENDOCRINE: No polyuria, nocturia,  HEMATOLOGY: No anemia, easy bruising or bleeding SKIN: No rash or lesion. MUSCULOSKELETAL: left hip pain as above, otherwise No joint pain or arthritis.   NEUROLOGIC: No tingling, numbness, weakness.  PSYCHIATRY: No anxiety or depression.   DRUG ALLERGIES:   Allergies  Allergen Reactions  . Amoxicillin     Other reaction(s): Unknown  . Penicillin G     Other reaction(s): Unknown    VITALS:  Blood pressure 153/59, pulse 75, temperature 98.8 F (37.1 C), temperature source Oral, resp. rate 18, height  (1.651 m), weight 51.4 kg (113 lb 5.1 oz), SpO2 95 %.  PHYSICAL EXAMINATION:  VITAL SIGNS: Filed Vitals:   04/30/15 0758 04/30/15 1149  BP: 125/60 153/59  Pulse: 83 75  Temp: 99 F (37.2 C) 98.8 F (37.1 C)  Resp: 17 18   GENERAL:75 y.o.female currently in no acute distress.  HEAD: Normocephalic, atraumatic.  EYES: Pupils equal, round, reactive to light. Extraocular muscles intact. No scleral icterus.  MOUTH: Moist mucosal membrane. Dentition intact. No abscess noted.  EAR, NOSE, THROAT: Clear without exudates. No external lesions.  NECK: Supple. No thyromegaly. No nodules. No JVD.  PULMONARY: Clear to ascultation, without wheeze rails or rhonci. No use of accessory muscles, Good respiratory effort. good air  entry bilaterally CHEST: Nontender to palpation.  CARDIOVASCULAR: S1 and S2. Regular rate and rhythm. No murmurs, rubs, or gallops. No edema. Pedal pulses 2+ bilaterally.  GASTROINTESTINAL: Soft, nontender, nondistended. No masses. Positive bowel sounds. No hepatosplenomegaly.  MUSCULOSKELETAL: No swelling, clubbing, or edema. Range of motion full in all extremities.  NEUROLOGIC: Cranial nerves II through XII are intact. No gross focal neurological deficits. Sensation intact. Reflexes intact.  SKIN: No ulceration, lesions, rashes, or cyanosis. Skin warm and dry. Turgor intact.  PSYCHIATRIC: Mood, affect within normal limits. The patient is awake, alert and oriented x 2. Insight, judgment intact.      LABORATORY PANEL:   CBC  Recent Labs Lab 04/30/15 0410  WBC 13.5*  HGB 11.9*  HCT 35.4  PLT 241   ------------------------------------------------------------------------------------------------------------------  Chemistries   Recent Labs Lab 04/29/15 1607 04/30/15 0410  NA  --  138  K  --  3.7  CL  --  112*  CO2  --  23  GLUCOSE  --  115*  BUN  --  24*  CREATININE  --  0.61  CALCIUM  --  8.4*  MG 2.3  --    ------------------------------------------------------------------------------------------------------------------  Cardiac Enzymes  Recent Labs Lab 04/29/15 1007  TROPONINI <0.03   ------------------------------------------------------------------------------------------------------------------  RADIOLOGY:  Dg Elbow Complete Left  04/29/2015  CLINICAL DATA:  75 year old female with left elbow pain after falling EXAM: LEFT ELBOW - COMPLETE 3+ VIEW COMPARISON:  Prior intraoperative radiographs of the left elbow 01/18/2014 FINDINGS: Perhaps trace elevation of the anterior fat pad suggesting the presence of a small elbow joint effusion. However, no  definite fracture visualized. Surgical changes of prior operative repair of a proximal ulnar fracture with posterior  buttress plate and screw construct. No evidence of hardware complication. IMPRESSION: 1. Perhaps small elbow joint effusion without definitive fracture. 2. Stable postsurgical changes of ORIF of a proximal ulnar fracture without evidence of hardware complication. Electronically Signed   By: Malachy Moan M.D.   On: 04/29/2015 11:46   Ct Head Wo Contrast  04/29/2015  CLINICAL DATA:  Fall resulting from syncopal episode. Dementia. Patient amnestic for event. EXAM: CT HEAD WITHOUT CONTRAST TECHNIQUE: Contiguous axial images were obtained from the base of the skull through the vertex without intravenous contrast. COMPARISON:  12/06/2011. FINDINGS: No evidence for acute infarction, hemorrhage, mass lesion, hydrocephalus, or extra-axial fluid. Mild atrophy not unexpected for age. Hypoattenuation of white matter suggestive of chronic microvascular ischemic change. Calvarium intact. Mild vascular calcification. No sinus or mastoid air fluid level. IMPRESSION: Chronic changes as described. No skull fracture or intracranial hemorrhage. Electronically Signed   By: Elsie Stain M.D.   On: 04/29/2015 11:49   Ct Hip Left Wo Contrast  04/29/2015  CLINICAL DATA:  Status post fall 04/28/2015. Left hip pain. The patient is unable to bear weight. EXAM: CT OF THE LEFT HIP WITHOUT CONTRAST TECHNIQUE: Multidetector CT imaging of the left hip was performed according to the standard protocol. Multiplanar CT image reconstructions were also generated. COMPARISON:  Plain films of the left hip earlier today. FINDINGS: The patient has an acute fracture of the left inferior pubic ramus with mild fragment override medially and buckling of the cortex laterally. No other fracture is identified. The left hip is located. There is a small left hip joint effusion. Minimal degenerative change is present about the left hip. Infiltration of subcutaneous fat over the left greater trochanter is compatible with contusion. Imaged muscles appear  normal. Imaged intrapelvic contents demonstrate a small volume of free pelvic fluid. IMPRESSION: Acute left inferior pubic ramus fracture. No other fracture is identified. Small left hip joint effusion. Small volume of free pelvic fluid of unknown etiology. Electronically Signed   By: Drusilla Kanner M.D.   On: 04/29/2015 14:15   US Carotid Bilateral  04/29/2015  CLINICAL DATA:  Syncopal episodes EXAM: BILATERAL CAROTID DUPLEX ULTRASOUND TECHNIQUE: Wallace Cullens scale imaging, color Doppler and duplex ultrasound were performed of bilateral carotid and vertebral arteries in the neck. COMPARISON:  None. FINDINGS: Criteria: Quantification of carotid stenosis is based on velocity parameters that correlate the residual internal carotid diameter with NASCET-based stenosis levels, using the diameter of the distal internal carotid lumen as the denominator for stenosis measurement. The following velocity measurements were obtained: RIGHT ICA:  77/13 cm/sec CCA:  111/13 cm/sec SYSTOLIC ICA/CCA RATIO:  0.68 DIASTOLIC ICA/CCA RATIO:  1 ECA:  198 cm/sec LEFT ICA:  164/27 cm/sec CCA:  118/14 cm/sec SYSTOLIC ICA/CCA RATIO:  0.4 DIASTOLIC ICA/CCA RATIO:  2.0 ECA:  165 cm/sec RIGHT CAROTID ARTERY: Preliminary grayscale images demonstrate atherosclerotic plaque in the region of the carotid bulb and proximal internal carotid artery. The waveforms, velocities and flow velocity ratios however demonstrate no evidence of focal hemodynamically significant stenosis. RIGHT VERTEBRAL ARTERY:  Antegrade in nature. LEFT CAROTID ARTERY: Primary grayscale images demonstrate atherosclerotic plaque in the carotid bulb and proximal internal carotid artery. The waveforms, velocities and flow velocity ratios are consistent with a stenosis in the 50-69% range. LEFT VERTEBRAL ARTERY:  Antegrade in nature. IMPRESSION: No focal stenosis on the right. 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 Electronically Signed  By: Alcide CleverMark  Lukens M.D.   On:  04/29/2015 18:20   Dg Knee Complete 4 Views Left  04/29/2015  CLINICAL DATA:  LEFT posterior hip pain, LEFT elbow pain.  , fall EXAM: LEFT KNEE - COMPLETE 4+ VIEW COMPARISON:  None. FINDINGS: There is narrowing of the medial compartment with associated sclerosis and osteophytosis. No evidence of fracture dislocation. No joint effusion. IMPRESSION: 1. No acute findings in the LEFT knee. 2. Osteoarthritis of the medial compartment. Electronically Signed   By: Genevive BiStewart  Edmunds M.D.   On: 04/29/2015 11:43   Dg Hip Unilat With Pelvis 2-3 Views Left  04/29/2015  CLINICAL DATA:  Pt to ED post fall, c/o left posterior hip pain, left elbow pain, previous left elbow injury post fall with repair 01/2014; h/o dementia. EXAM: DG HIP (WITH OR WITHOUT PELVIS) 2-3V LEFT COMPARISON:  None. FINDINGS: Hips are located. No evidence of pelvic fracture or sacral fracture. Dedicated view of the LEFT hip demonstrates no femoral neck fracture. Posterior lumbar fusion IMPRESSION: No pelvic fracture or LEFT hip fracture. Electronically Signed   By: Genevive BiStewart  Edmunds M.D.   On: 04/29/2015 11:42    EKG:   Orders placed or performed during the hospital encounter of 04/29/15  . ED EKG  . ED EKG  . EKG 12-Lead  . EKG 12-Lead    ASSESSMENT AND PLAN:   75 year old caucasian female admitted 04/29/15 after falling.  1. Mechanical fall: originally believed to be syncope however story is more convincing for fall. - discontinue telemetry, check vitamin d level, pt eval 2. Pelvic fracture: ortho consult, pain meds, bowel regiment  3. Dementia: namenda 4. Venous thromboembolism prophylactic: lovenox  Disposition: snf     All the records are reviewed and case discussed with Care Management/Social Workerr. Management plans discussed with the patient, family and they are in agreement.  CODE STATUS: full  TOTAL TIME TAKING CARE OF THIS PATIENT: 28 minutes.   POSSIBLE D/C IN 1-2 DAYS, DEPENDING ON CLINICAL  CONDITION.   Areli Frary,  Mardi MainlandDavid K M.D on 04/30/2015 at 3:00 PM  Between 7am to 6pm - Pager - [phone]  After 6pm: House Pager: - [phone]  [facility]  Office  [phone]  CC: Primary care physician; No primary care provider on file.

## 2015-04-30 NOTE — Plan of Care (Signed)
Problem: Acute Rehab PT Goals(only PT should resolve) Goal: Pt Will Go Supine/Side To Sit With correct body mechanics    Goal: Pt Will Ambulate With safe reciprocal gait pattern

## 2015-05-01 ENCOUNTER — Encounter
Admission: RE | Admit: 2015-05-01 | Discharge: 2015-05-01 | Disposition: A | Discharge status: Home / Self Care | Payer: Medicare Other | Source: Ambulatory Visit | Attending: Internal Medicine | Admitting: Internal Medicine

## 2015-05-01 LAB — ECHOCARDIOGRAM COMPLETE
Height: 65 in
Weight: 1813.06 oz

## 2015-05-01 LAB — VITAMIN D 25 HYDROXY (VIT D DEFICIENCY, FRACTURES): Vit D, 25-Hydroxy: 14.3 ng/mL — ABNORMAL LOW (ref 30.0–100.0)

## 2015-05-01 MED ORDER — DOCUSATE SODIUM 100 MG PO CAPS: 100.0000 mg | ORAL_CAPSULE | Freq: Two times a day (BID) | ORAL | Status: DC | PRN

## 2015-05-01 MED ORDER — LORAZEPAM 2 MG/ML IJ SOLN
0.5000 mg | Freq: Once | INTRAMUSCULAR | Status: AC
  Administered 2015-05-01: 0.5 mg via INTRAVENOUS
  Filled 2015-05-01: qty 1

## 2015-05-01 MED ORDER — HYDROCODONE-ACETAMINOPHEN 5-325 MG PO TABS: 1.0000 | ORAL_TABLET | ORAL | Status: DC | PRN

## 2015-05-01 NOTE — Clinical Social Work Placement (Signed)
   CLINICAL SOCIAL WORK PLACEMENT  NOTE  Date:  05/01/2015  Patient Details  Name: Katie Fitzgerald MRN: [id number] Date of Birth: 05/18/1940  Clinical Social Work is seeking post-discharge placement for this patient at the Skilled  Nursing Facility level of care (*CSW will initial, date and re-position this form in  chart as items are completed):  Yes   Patient/family provided with [facility] Clinical Social Work Department's list of facilities offering this level of care within the geographic area requested by the patient (or if unable, by the patient's family).  Yes   Patient/family informed of their freedom to choose among providers that offer the needed level of care, that participate in Medicare, Medicaid or managed care program needed by the patient, have an available bed and are willing to accept the patient.  Yes   Patient/family informed of [facility]'s ownership interest in [facility] and [facility], as well as of the fact that they are under no obligation to receive care at these facilities.  PASRR submitted to EDS on       PASRR number received on       Existing PASRR number confirmed on 05/01/15     FL2 transmitted to all facilities in geographic area requested by pt/family on 05/01/15     FL2 transmitted to all facilities within larger geographic area on       Patient informed that his/her managed care company has contracts with or will negotiate with certain facilities, including the following:        Yes   Patient/family informed of bed offers received.  Patient chooses bed at  [facility] )     Physician recommends and patient chooses bed at      Patient to be transferred to  [facility] ) on 05/01/15.  Patient to be transferred to facility by  [facility] )     Patient family notified on 05/01/15 of transfer.  Name of family member notified:   (Patient's daughter Wynona CanesChristine is aware of D/C today. )     PHYSICIAN        Additional Comment:    _______________________________________________ Haig ProphetMorgan, Katie Dodge G, LCSW 05/01/2015, 11:47 AM

## 2015-05-01 NOTE — Progress Notes (Signed)
Dr. Anne HahnWillis notified that pt is still very impulsive and confusion with agitation is increased. MD to place orders.

## 2015-05-01 NOTE — Progress Notes (Addendum)
Physical Therapy Treatment Patient Details Name: Katie MainsDianne V Fitzgerald MRN: [id number] DOB: 07/05/1940 Today's Date: 05/01/2015    History of Present Illness 75 yo female with history of dementia [facility] a fall with L hip pain from LOC.  Has L inferior pubic ramus fracture with WBAT.  PMHx:  lumbar fusion and dementia,    PT Comments    Pt progressing with bed mobility, transfers and ambulation. Less assist required for bed mobility and transfers. Movement continues very slow. Ambulation distance improving; quality continues to require work with pain and vaulting on Left lower extremity as well as significantly slow speed and at times poor safety awareness requiring increased cues and assist. Pt requires assist for toileting. Received up in chair comfortably. At the time of this documentation, pt now has discharge orders to skilled nursing facility today. Complete current PT orders.   Follow Up Recommendations  SNF     Equipment Recommendations       Recommendations for Other Services       Precautions / Restrictions Precautions Precautions: Fall Restrictions Weight Bearing Restrictions: No    Mobility  Bed Mobility Overal bed mobility: Needs Assistance Bed Mobility: Supine to Sit     Supine to sit: Min guard     General bed mobility comments: slow moving; requires cues   Transfers Overall transfer level: Needs assistance Equipment used: Rolling walker (2 wheeled);1 person hand held assist Transfers: Sit to/from Stand Sit to Stand: Min guard         General transfer comment: cues for safe hand placement; slow to rise  Ambulation/Gait Ambulation/Gait assistance: Min guard Ambulation Distance (Feet): 25 Feet (second walk 30 ft) Assistive device: Rolling walker (2 wheeled);1 person hand held assist Gait Pattern/deviations: Step-to pattern;Decreased weight shift to left;Antalgic Gait velocity: slow, numerous pauses needing cues to continue Gait  velocity interpretation: <1.8 ft/sec, indicative of risk for recurrent falls General Gait Details: very slow with increased pain and diffficulty with weighbearing on LLE; vaulting on L. Requires heavy cueing/tactile cueing at times for safety, as pt attemptes to abandon rw. Also requires heavy cueing for navigation for chair approach and assist with maneuvering rw.    Stairs            Wheelchair Mobility    Modified Rankin (Stroke Patients Only)       Balance Overall balance assessment: Needs assistance Sitting-balance support: Feet supported Sitting balance-Leahy Scale: Good     Standing balance support: Bilateral upper extremity supported Standing balance-Leahy Scale: Fair                      Cognition Arousal/Alertness: Awake/alert (Initially sleeping; easily awoken) Behavior During Therapy: WFL for tasks assessed/performed Overall Cognitive Status: History of cognitive impairments - at baseline                      Exercises General Exercises - Lower Extremity Long Arc Quad: AROM;Both;20 reps;Seated Hip ABduction/ADduction: AROM;Both;20 reps;Seated Hip Flexion/Marching: AROM;Both;20 reps;Seated Toe Raises: AROM;20 reps;Both;Seated Heel Raises: AROM;Both;20 reps;Seated Other Exercises Other Exercises: set up and assist with stand balance with personal hygiene with toileting    General Comments        Pertinent Vitals/Pain Pain Assessment:  (reports mild to moderate pain L hip) Pain Location: L hip/thigh Pain Intervention(s): Monitored during session;Repositioned    Home Living  Prior Function            PT Goals (current goals can now be found in the care plan section) Progress towards PT goals: Progressing toward goals    Frequency  Min 2X/week    PT Plan Current plan remains appropriate    Co-evaluation             End of Session Equipment Utilized During Treatment: Gait belt Activity  Tolerance: Patient tolerated treatment well (increased pain with weightbearing on L) Patient left: in chair;with call bell/phone within reach;with chair alarm set;with family/visitor present     Time: 9604-5409 PT Time Calculation (min) (ACUTE ONLY): 47 min  Charges:  $Gait Training: 8-22 mins $Therapeutic Exercise: 8-22 mins $Therapeutic Activity: 8-22 mins                    G Codes:      Kristeen Miss, PTA 05/01/2015, 10:33 AM

## 2015-05-01 NOTE — Care Management (Signed)
[facility] which does not require 3-inpatient night stay. PT is recommending SNF

## 2015-05-01 NOTE — Progress Notes (Signed)
MD said to discontinue telemetry 

## 2015-05-01 NOTE — Discharge Summary (Signed)
[facility] [place] [facility]   PATIENT NAME: Katie DallasDianne Fitzgerald    MR#:  [id number]  DATE OF BIRTH:  08/21/1940  DATE OF ADMISSION:  04/29/2015 ADMITTING PHYSICIAN: Shaune PollackQing Chen, MD  DATE OF DISCHARGE: No discharge date for patient encounter.  PRIMARY CARE PHYSICIAN: No primary care provider on file.    ADMISSION DIAGNOSIS:  Syncope and collapse [R55] Pubic ramus fracture, left, closed, initial encounter (HCC) [S32.592A]  DISCHARGE DIAGNOSIS:  Principal Problem:   Fracture of left inferior pubic ramus (HCC) Active Problems:   Syncope syncope ruled out - mechanical fall  SECONDARY DIAGNOSIS:   Past Medical History  Diagnosis Date  . Hypertension   . Osteoarthrosis   . Dementia     HOSPITAL COURSE:  Katie Fitzgerald  is a 75 y.o. female admitted 04/29/2015 with chief complaint fall and hip pain. Please see H&P by Dr. Imogene Burnhen for further information. Patient originally presented to the hospital after what was believed to be syncopal episode however, on further information it appears as a mechanical fall regardless, she was also placed on cardiac telemetry without any acute findings. Initial evaluation in the emergency department revealed left inferior pubic ramus fracture. Evaluated by orthopedic surgeryor surgical intervention required, provide pain medication.  DISCHARGE CONDITIONS:   Stable  CONSULTS OBTAINED:  Treatment Team:  Deeann SaintHoward Miller, MD  DRUG ALLERGIES:   Allergies  Allergen Reactions  . Amoxicillin     Other reaction(s): Unknown  . Penicillin G     Other reaction(s): Unknown    DISCHARGE MEDICATIONS:   Current Discharge Medication List    START taking these medications   Details  docusate sodium (COLACE) 100 MG capsule Take 1 capsule (100 mg total) by mouth 2 (two) times daily as needed for mild constipation. Qty: 30 capsule, Refills: 0    HYDROcodone-acetaminophen (NORCO/VICODIN) 5-325 MG tablet Take 1-2 tablets by mouth  every 4 (four) hours as needed for moderate pain. Qty: 30 tablet, Refills: 0      CONTINUE these medications which have NOT CHANGED   Details  citalopram (CELEXA) 20 MG tablet Take 10-20 mg by mouth See admin instructions. Take 1/2 tablet (10mg ) by mouth daily for 1 week, then increase to 1 tablet by mouth every day.    memantine (NAMENDA) 10 MG tablet Take 10 mg by mouth 2 (two) times daily. Refills: 11         DISCHARGE INSTRUCTIONS:  Weight bearing as tolerated with walker on the left Follow-up orthopedic surgery 2 weeks for repeat x-ray   DIET:  Regular diet  DISCHARGE CONDITION:  Stable  ACTIVITY:  Weightbearing as tolerated with walker on left  OXYGEN:  Home Oxygen: No.   Oxygen Delivery: room air  DISCHARGE LOCATION:  nursing home   If you experience worsening of your admission symptoms, develop shortness of breath, life threatening emergency, suicidal or homicidal thoughts you must seek medical attention immediately by calling 911 or calling your MD immediately  if symptoms less severe.  You Must read complete instructions/literature along with all the possible adverse reactions/side effects for all the Medicines you take and that have been prescribed to you. Take any new Medicines after you have completely understood and accpet all the possible adverse reactions/side effects.   Please note  You were cared for by a hospitalist during your hospital stay. If you have any questions about your discharge medications or the care you received while you were in the hospital after you are discharged, you can  call the unit and asked to speak with the hospitalist on call if the hospitalist that took care of you is not available. Once you are discharged, your primary care physician will handle any further medical issues. Please note that NO REFILLS for any discharge medications will be authorized once you are discharged, as it is imperative that you return to your primary care  physician (or establish a relationship with a primary care physician if you do not have one) for your aftercare needs so that they can reassess your need for medications and monitor your lab values.    On the day of Discharge:   VITAL SIGNS:  Blood pressure 129/76, pulse 94, temperature 98.5 F (36.9 C), temperature source Oral, resp. rate 18, height 5\' 5"  (1.651 m), weight 51.4 kg (113 lb 5.1 oz), SpO2 98 %.  I/O:   Intake/Output Summary (Last 24 hours) at 05/01/15 0948 Last data filed at 05/01/15 1610  Gross per 24 hour  Intake 2115.17 ml  Output   1350 ml  Net 765.17 ml    PHYSICAL EXAMINATION:  GENERAL:  75 y.o.-year-old patient lying in the bed with no acute distress.  EYES: Pupils equal, round, reactive to light and accommodation. No scleral icterus. Extraocular muscles intact.  HEENT: Head atraumatic, normocephalic. Oropharynx and nasopharynx clear.  NECK:  Supple, no jugular venous distention. No thyroid enlargement, no tenderness.  LUNGS: Normal breath sounds bilaterally, no wheezing, rales,rhonchi or crepitation. No use of accessory muscles of respiration.  CARDIOVASCULAR: S1, S2 normal. No murmurs, rubs, or gallops.  ABDOMEN: Soft, non-tender, non-distended. Bowel sounds present. No organomegaly or mass.  EXTREMITIES: No pedal edema, cyanosis, or clubbing.  NEUROLOGIC: Cranial nerves II through XII are intact. Muscle strength 5/5 in all extremities. Sensation intact. Gait not checked.  PSYCHIATRIC: The patient is alert and oriented x 3.  SKIN: No obvious rash, lesion, or ulcer.   DATA REVIEW:   CBC  Recent Labs Lab 04/30/15 0410  WBC 13.5*  HGB 11.9*  HCT 35.4  PLT 241    Chemistries   Recent Labs Lab 04/29/15 1607 04/30/15 0410  NA  --  138  K  --  3.7  CL  --  112*  CO2  --  23  GLUCOSE  --  115*  BUN  --  24*  CREATININE  --  0.61  CALCIUM  --  8.4*  MG 2.3  --     Cardiac Enzymes  Recent Labs Lab 04/29/15 1007  TROPONINI <0.03     Microbiology Results  No results found for this or any previous visit.  RADIOLOGY:  Dg Elbow Complete Left  04/29/2015  CLINICAL DATA:  75 year old female with left elbow pain after falling EXAM: LEFT ELBOW - COMPLETE 3+ VIEW COMPARISON:  Prior intraoperative radiographs of the left elbow 01/18/2014 FINDINGS: Perhaps trace elevation of the anterior fat pad suggesting the presence of a small elbow joint effusion. However, no definite fracture visualized. Surgical changes of prior operative repair of a proximal ulnar fracture with posterior buttress plate and screw construct. No evidence of hardware complication. IMPRESSION: 1. Perhaps small elbow joint effusion without definitive fracture. 2. Stable postsurgical changes of ORIF of a proximal ulnar fracture without evidence of hardware complication. Electronically Signed   By: Malachy Moan M.D.   On: 04/29/2015 11:46   Ct Head Wo Contrast  04/29/2015  CLINICAL DATA:  Fall resulting from syncopal episode. Dementia. Patient amnestic for event. EXAM: CT HEAD WITHOUT CONTRAST TECHNIQUE: Contiguous axial images  were obtained from the base of the skull through the vertex without intravenous contrast. COMPARISON:  12/06/2011. FINDINGS: No evidence for acute infarction, hemorrhage, mass lesion, hydrocephalus, or extra-axial fluid. Mild atrophy not unexpected for age. Hypoattenuation of white matter suggestive of chronic microvascular ischemic change. Calvarium intact. Mild vascular calcification. No sinus or mastoid air fluid level. IMPRESSION: Chronic changes as described. No skull fracture or intracranial hemorrhage. Electronically Signed   By: Elsie Stain M.D.   On: 04/29/2015 11:49   Ct Hip Left Wo Contrast  04/29/2015  CLINICAL DATA:  Status post fall 04/28/2015. Left hip pain. The patient is unable to bear weight. EXAM: CT OF THE LEFT HIP WITHOUT CONTRAST TECHNIQUE: Multidetector CT imaging of the left hip was performed according to the standard  protocol. Multiplanar CT image reconstructions were also generated. COMPARISON:  Plain films of the left hip earlier today. FINDINGS: The patient has an acute fracture of the left inferior pubic ramus with mild fragment override medially and buckling of the cortex laterally. No other fracture is identified. The left hip is located. There is a small left hip joint effusion. Minimal degenerative change is present about the left hip. Infiltration of subcutaneous fat over the left greater trochanter is compatible with contusion. Imaged muscles appear normal. Imaged intrapelvic contents demonstrate a small volume of free pelvic fluid. IMPRESSION: Acute left inferior pubic ramus fracture. No other fracture is identified. Small left hip joint effusion. Small volume of free pelvic fluid of unknown etiology. Electronically Signed   By: Drusilla Kanner M.D.   On: 04/29/2015 14:15   US Carotid Bilateral  04/29/2015  CLINICAL DATA:  Syncopal episodes EXAM: BILATERAL CAROTID DUPLEX ULTRASOUND TECHNIQUE: Wallace Cullens scale imaging, color Doppler and duplex ultrasound were performed of bilateral carotid and vertebral arteries in the neck. COMPARISON:  None. FINDINGS: Criteria: Quantification of carotid stenosis is based on velocity parameters that correlate the residual internal carotid diameter with NASCET-based stenosis levels, using the diameter of the distal internal carotid lumen as the denominator for stenosis measurement. The following velocity measurements were obtained: RIGHT ICA:  77/13 cm/sec CCA:  111/13 cm/sec SYSTOLIC ICA/CCA RATIO:  0.68 DIASTOLIC ICA/CCA RATIO:  1 ECA:  198 cm/sec LEFT ICA:  164/27 cm/sec CCA:  118/14 cm/sec SYSTOLIC ICA/CCA RATIO:  0.4 DIASTOLIC ICA/CCA RATIO:  2.0 ECA:  165 cm/sec RIGHT CAROTID ARTERY: Preliminary grayscale images demonstrate atherosclerotic plaque in the region of the carotid bulb and proximal internal carotid artery. The waveforms, velocities and flow velocity ratios however  demonstrate no evidence of focal hemodynamically significant stenosis. RIGHT VERTEBRAL ARTERY:  Antegrade in nature. LEFT CAROTID ARTERY: Primary grayscale images demonstrate atherosclerotic plaque in the carotid bulb and proximal internal carotid artery. The waveforms, velocities and flow velocity ratios are consistent with a stenosis in the 50-69% range. LEFT VERTEBRAL ARTERY:  Antegrade in nature. IMPRESSION: No focal stenosis on the right. 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 Electronically Signed   By: Alcide Clever M.D.   On: 04/29/2015 18:20   Dg Knee Complete 4 Views Left  04/29/2015  CLINICAL DATA:  LEFT posterior hip pain, LEFT elbow pain.  , fall EXAM: LEFT KNEE - COMPLETE 4+ VIEW COMPARISON:  None. FINDINGS: There is narrowing of the medial compartment with associated sclerosis and osteophytosis. No evidence of fracture dislocation. No joint effusion. IMPRESSION: 1. No acute findings in the LEFT knee. 2. Osteoarthritis of the medial compartment. Electronically Signed   By: Genevive Bi M.D.   On: 04/29/2015 11:43  Dg Hip Unilat With Pelvis 2-3 Views Left  04/29/2015  CLINICAL DATA:  Pt to ED post fall, c/o left posterior hip pain, left elbow pain, previous left elbow injury post fall with repair 01/2014; h/o dementia. EXAM: DG HIP (WITH OR WITHOUT PELVIS) 2-3V LEFT COMPARISON:  None. FINDINGS: Hips are located. No evidence of pelvic fracture or sacral fracture. Dedicated view of the LEFT hip demonstrates no femoral neck fracture. Posterior lumbar fusion IMPRESSION: No pelvic fracture or LEFT hip fracture. Electronically Signed   By: Genevive Bi M.D.   On: 04/29/2015 11:42     Management plans discussed with the patient, family and they are in agreement.  CODE STATUS:     Code Status Orders        Start     Ordered   04/29/15 1551  Full code   Continuous     04/29/15 1550    Code Status History    Date Active Date Inactive Code Status Order ID Comments User  Context   This patient has a current code status but no historical code status.    Advance Directive Documentation        Most Recent Value   Type of Advance Directive  Healthcare Power of Attorney   Pre-existing out of facility DNR order (yellow form or pink MOST form)     "MOST" Form in Place?        TOTAL TIME TAKING CARE OF THIS PATIENT: 31 minutes.    Mahari Vankirk,  Mardi Mainland.D on 05/01/2015 at 9:48 AM  Between 7am to 6pm - Pager - [phone]  After 6pm go to [email or web address] - password [email or web address] [facility]  [place] [facility]  Office  [phone]  CC: Primary care physician; No primary care provider on file.

## 2015-05-01 NOTE — Progress Notes (Signed)
Checked to see if pre-authorization would be needed for non-emergent EMS transport. Per [employer] benefits obtained online through [employer], patient has a [employer] Medicare Advantage PPO policy.  Medicare PPO plans do not require pre-auth for non-emergent ground transports using service codes A0426 or A0428.   

## 2015-05-01 NOTE — Clinical Social Work Note (Signed)
Clinical Social Work Assessment  Patient Details  Name: Katie Fitzgerald MRN: [id number] Date of Birth: 08/03/1940  Date of referral:  05/01/15               Reason for consult:  Facility Placement                Permission sought to share information with:  Facility Contact Representative Permission granted to share information::  Yes, Verbal Permission Granted  Name::      [facility]  Agency::   Skilled Nursing Facility   Relationship::     Contact Information:     Housing/Transportation Living arrangements for the past 2 months:  Single Family Home Source of Information:  Power of Attorney, Adult Children Patient Interpreter Needed:  None Criminal Activity/Legal Involvement Pertinent to Current Situation/Hospitalization:  No - Comment as needed Significant Relationships:  Adult Children, Other Family Members Lives with:  Adult Children Do you feel safe going back to the place where you live?  Yes Need for family participation in patient care:  Yes (Comment)  Care giving concerns:  Patient lives in a cottage that is attached to her daughter Christine's home.    Social Worker assessment / plan:  Clinical Social Worker (CSW) received SNF consult. Patient has a pelvic fracture and dementia. PT is recommending SNF. CSW met with patient and her daughter Christine [phone] was at bedside. Daughter requested to speak to CSW outside of the room. CSW met with daughter alone outside of the room. Daughter reported that she lives in [place] with her. Per daughter patient has a cottage that is built on to her home. Per daughter 3 grandchildren also live in the home. Daughter reported that she is HPOA and does not like to speak in front of patient because patient becomes anxious and stressed. CSW explained that PT is recommending SNF. CSW explained SNF process. Per daughter patient has been to [facility] before. Daughter is agreeable to SNF search and prefers a private room at  [facility].   CSW presented bed offers to daughter she chose [facility]. Patient is medically stable for D/C to [facility] today. Per Kim admissions coordinator at [facility] patient will go to a semi private room with no one in it and be put on the waiting list for a private room. Daughter is agreeable with above. Patient will go to room 203-A. RN will call report at [phone] and arrange EMS for transport. Daughter is going to [facility] today to complete admissions paper work. Please reconsult if future social work needs arise. CSW signing off.   Employment status:  Retired, Disabled (Comment on whether or not currently receiving Disability) Insurance information:  Managed Medicare PT Recommendations:  Skilled Nursing Facility Information / Referral to community resources:  Skilled Nursing Facility  Patient/Family's Response to care:  Patient's daughter is agreeable for patient to D/C to [facility] today.   Patient/Family's Understanding of and Emotional Response to Diagnosis, Current Treatment, and Prognosis:  Daughter was pleasant throughout assessment and thanked CSW for visit.   Emotional Assessment Appearance:  Appears stated age Attitude/Demeanor/Rapport:  Unable to Assess Affect (typically observed):  Unable to Assess Orientation:  Oriented to Self, Fluctuating Orientation (Suspected and/or reported Sundowners) Alcohol / Substance use:  Not Applicable Psych involvement (Current and /or in the community):  No (Comment)  Discharge Needs  Concerns to be addressed:  No discharge needs identified Readmission within the last 30 days:  No Current discharge risk:  None Barriers   to Discharge:  No Barriers Identified   Elwyn Reach 05/01/2015, 11:49 AM

## 2015-05-01 NOTE — NC FL2 (Signed)
[place] MEDICAID FL2 LEVEL OF CARE SCREENING TOOL     IDENTIFICATION  Patient Name: Katie Fitzgerald Birthdate: 1940-07-12 Sex: female Admission Date (Current Location): 04/29/2015  [place] and [place] Number:  Chiropodist and Address:  [facility], [address] [place], [place] [address]      Provider Number: [id number]  Attending Physician Name and Address:  Wyatt Haste, MD  Relative Name and Phone Number:       Current Level of Care: Hospital Recommended Level of Care: Skilled Nursing Facility Prior Approval Number:    Date Approved/Denied:   PASRR Number:  ( [id number] )  Discharge Plan: SNF    Current Diagnoses: Patient Active Problem List   Diagnosis Date Noted  . Syncope 04/29/2015  . Fracture of left inferior pubic ramus (HCC) 04/29/2015    Orientation RESPIRATION BLADDER Height & Weight     Self  Normal Continent Weight: 113 lb 5.1 oz (51.4 kg) Height:   (165.1 cm)  BEHAVIORAL SYMPTOMS/MOOD NEUROLOGICAL BOWEL NUTRITION STATUS   (none )  (none ) Continent Diet (Diet: Heart Healthy )  AMBULATORY STATUS COMMUNICATION OF NEEDS Skin   Extensive Assist Verbally Normal                       Personal Care Assistance Level of Assistance  Bathing, Feeding, Dressing Bathing Assistance: Limited assistance Feeding assistance: Independent Dressing Assistance: Limited assistance     Functional Limitations Info  Sight, Hearing, Speech Sight Info: Adequate Hearing Info: Adequate Speech Info: Adequate    SPECIAL CARE FACTORS FREQUENCY  PT (By licensed PT), OT (By licensed OT)     PT Frequency:  (5) OT Frequency:  (5)            Contractures      Additional Factors Info  Code Status, Allergies Code Status Info:  (Full Code. ) Allergies Info:  (Amoxicillin, Penicillin G)           Current Medications (05/01/2015):  This is the current hospital active medication list Current  Facility-Administered Medications  Medication Dose Route Frequency Provider Last Rate Last Dose  . 0.9 %  sodium chloride infusion   Intravenous Continuous Shaune Pollack, MD 50 mL/hr at 05/01/15 0914    . acetaminophen (TYLENOL) tablet 650 mg  650 mg Oral Q6H PRN Shaune Pollack, MD       Or  . acetaminophen (TYLENOL) suppository 650 mg  650 mg Rectal Q6H PRN Shaune Pollack, MD      . albuterol (PROVENTIL) (2.5 MG/3ML) 0.083% nebulizer solution 2.5 mg  2.5 mg Nebulization Q2H PRN Shaune Pollack, MD      . bisacodyl (DULCOLAX) EC tablet 5 mg  5 mg Oral Daily PRN Shaune Pollack, MD      . citalopram (CELEXA) tablet 10 mg  10 mg Oral Daily Shaune Pollack, MD   10 mg at 04/30/15 [phone]  . [START ON 05/06/2015] citalopram (CELEXA) tablet 20 mg  20 mg Oral Daily Shaune Pollack, MD      . docusate sodium (COLACE) capsule 100 mg  100 mg Oral BID Shaune Pollack, MD   100 mg at 04/30/15 2241  . enoxaparin (LOVENOX) injection 40 mg  40 mg Subcutaneous Q24H Shaune Pollack, MD   40 mg at 04/30/15 2241  . HYDROcodone-acetaminophen (NORCO/VICODIN) 5-325 MG per tablet 1-2 tablet  1-2 tablet Oral Q4H PRN Shaune Pollack, MD   1 tablet at 04/30/15 0946  . memantine (NAMENDA)  tablet 10 mg  10 mg Oral BID Shaune PollackQing Chen, MD   10 mg at 04/30/15 2241  . morphine 2 MG/ML injection 2 mg  2 mg Intravenous Q4H PRN Wyatt Hasteavid K Hower, MD      . ondansetron [facility](ZOFRAN) tablet 4 mg  4 mg Oral Q6H PRN Shaune PollackQing Chen, MD       Or  . ondansetron [facility](ZOFRAN) injection 4 mg  4 mg Intravenous Q6H PRN Shaune PollackQing Chen, MD      . senna-docusate (Senokot-S) tablet 1 tablet  1 tablet Oral QHS PRN Shaune PollackQing Chen, MD      . sodium chloride flush (NS) 0.9 % injection 3 mL  3 mL Intravenous Q12H Shaune PollackQing Chen, MD         Discharge Medications: Please see discharge summary for a list of discharge medications.  Relevant Imaging Results:  Relevant Lab Results:   Additional Information  (SSN: [id number])  Haig ProphetMorgan, Mikaya Bunner G, LCSW

## 2015-05-06 ENCOUNTER — Encounter
Admission: RE | Admit: 2015-05-06 | Discharge: 2015-05-06 | Disposition: A | Discharge status: Home / Self Care | Payer: Medicare Other | Source: Ambulatory Visit | Attending: Internal Medicine | Admitting: Internal Medicine

## 2015-05-06 DIAGNOSIS — R4182 Altered mental status, unspecified: Secondary | ICD-10-CM | POA: Insufficient documentation

## 2015-05-10 LAB — URINALYSIS COMPLETE WITH MICROSCOPIC (ARMC ONLY)
BILIRUBIN URINE: NEGATIVE
Glucose, UA: NEGATIVE mg/dL
Ketones, ur: NEGATIVE mg/dL
Leukocytes, UA: NEGATIVE
Nitrite: NEGATIVE
PH: 8 (ref 5.0–8.0)
Protein, ur: NEGATIVE mg/dL
Specific Gravity, Urine: 1.014 (ref 1.005–1.030)

## 2015-05-11 LAB — COMPREHENSIVE METABOLIC PANEL
ALK PHOS: 161 U/L — AB (ref 38–126)
ALT: 17 U/L (ref 14–54)
ANION GAP: 4 — AB (ref 5–15)
AST: 17 U/L (ref 15–41)
Albumin: 3.7 g/dL (ref 3.5–5.0)
BUN: 21 mg/dL — ABNORMAL HIGH (ref 6–20)
CALCIUM: 8.8 mg/dL — AB (ref 8.9–10.3)
CHLORIDE: 108 mmol/L (ref 101–111)
CO2: 27 mmol/L (ref 22–32)
Creatinine, Ser: 0.72 mg/dL (ref 0.44–1.00)
GFR calc non Af Amer: >60 mL/min (ref >60)
Glucose, Bld: 78 mg/dL (ref 65–99)
Potassium: 3.5 mmol/L (ref 3.5–5.1)
SODIUM: 139 mmol/L (ref 135–145)
Total Bilirubin: 0.6 mg/dL (ref 0.3–1.2)
Total Protein: 6.6 g/dL (ref 6.5–8.1)

## 2015-05-11 LAB — CBC WITH DIFFERENTIAL/PLATELET
Basophils Absolute: 0.1 10*3/uL (ref 0–0.1)
Basophils Relative: 1 %
EOS ABS: 0.2 10*3/uL (ref 0–0.7)
EOS PCT: 3 %
HCT: 35.9 % (ref 35.0–47.0)
Hemoglobin: 12.2 g/dL (ref 12.0–16.0)
LYMPHS ABS: 1.9 10*3/uL (ref 1.0–3.6)
Lymphocytes Relative: 25 %
MCH: 29.9 pg (ref 26.0–34.0)
MCHC: 33.8 g/dL (ref 32.0–36.0)
MCV: 88.5 fL (ref 80.0–100.0)
MONO ABS: 0.6 10*3/uL (ref 0.2–0.9)
MONOS PCT: 8 %
Neutro Abs: 4.7 10*3/uL (ref 1.4–6.5)
Neutrophils Relative %: 63 %
PLATELETS: 320 10*3/uL (ref 150–440)
RBC: 4.06 MIL/uL (ref 3.80–5.20)
RDW: 13.8 % (ref 11.5–14.5)
WBC: 7.5 10*3/uL (ref 3.6–11.0)

## 2015-05-11 LAB — TSH: TSH: 1.29 u[IU]/mL (ref 0.350–4.500)

## 2015-05-11 LAB — VITAMIN B12: Vitamin B-12: 265 pg/mL (ref 180–914)

## 2015-05-12 LAB — URINE CULTURE

## 2017-03-22 ENCOUNTER — Encounter: Payer: Self-pay | Admitting: Emergency Medicine

## 2017-03-22 ENCOUNTER — Inpatient Hospital Stay
Admission: EM | Admit: 2017-03-22 | Discharge: 2017-03-25 | DRG: 482 | Disposition: A | Discharge status: Skilled Nursing Facility | Payer: Medicare Other | Attending: Internal Medicine | Admitting: Internal Medicine

## 2017-03-22 ENCOUNTER — Emergency Department: Payer: Medicare Other

## 2017-03-22 ENCOUNTER — Other Ambulatory Visit: Payer: Self-pay

## 2017-03-22 ENCOUNTER — Inpatient Hospital Stay: Payer: Medicare Other

## 2017-03-22 DIAGNOSIS — Z79899 Other long term (current) drug therapy: Secondary | ICD-10-CM | POA: Diagnosis not present

## 2017-03-22 DIAGNOSIS — F39 Unspecified mood [affective] disorder: Secondary | ICD-10-CM | POA: Diagnosis not present

## 2017-03-22 DIAGNOSIS — Z981 Arthrodesis status: Secondary | ICD-10-CM | POA: Diagnosis not present

## 2017-03-22 DIAGNOSIS — M170 Bilateral primary osteoarthritis of knee: Secondary | ICD-10-CM | POA: Diagnosis present

## 2017-03-22 DIAGNOSIS — E876 Hypokalemia: Secondary | ICD-10-CM | POA: Diagnosis present

## 2017-03-22 DIAGNOSIS — I10 Essential (primary) hypertension: Secondary | ICD-10-CM | POA: Diagnosis present

## 2017-03-22 DIAGNOSIS — R441 Visual hallucinations: Secondary | ICD-10-CM | POA: Diagnosis not present

## 2017-03-22 DIAGNOSIS — S72009A Fracture of unspecified part of neck of unspecified femur, initial encounter for closed fracture: Secondary | ICD-10-CM | POA: Diagnosis present

## 2017-03-22 DIAGNOSIS — W010XXA Fall on same level from slipping, tripping and stumbling without subsequent striking against object, initial encounter: Secondary | ICD-10-CM | POA: Diagnosis present

## 2017-03-22 DIAGNOSIS — Y92009 Unspecified place in unspecified non-institutional (private) residence as the place of occurrence of the external cause: Secondary | ICD-10-CM

## 2017-03-22 DIAGNOSIS — F0390 Unspecified dementia without behavioral disturbance: Secondary | ICD-10-CM | POA: Diagnosis present

## 2017-03-22 DIAGNOSIS — G301 Alzheimer's disease with late onset: Secondary | ICD-10-CM | POA: Diagnosis not present

## 2017-03-22 DIAGNOSIS — S72001A Fracture of unspecified part of neck of right femur, initial encounter for closed fracture: Principal | ICD-10-CM | POA: Diagnosis present

## 2017-03-22 DIAGNOSIS — Z23 Encounter for immunization: Secondary | ICD-10-CM | POA: Diagnosis present

## 2017-03-22 DIAGNOSIS — M1990 Unspecified osteoarthritis, unspecified site: Secondary | ICD-10-CM | POA: Diagnosis present

## 2017-03-22 DIAGNOSIS — Z880 Allergy status to penicillin: Secondary | ICD-10-CM

## 2017-03-22 DIAGNOSIS — M25562 Pain in left knee: Secondary | ICD-10-CM

## 2017-03-22 LAB — CBC
HEMATOCRIT: 38.4 % (ref 35.0–47.0)
Hemoglobin: 12.7 g/dL (ref 12.0–16.0)
MCH: 29.5 pg (ref 26.0–34.0)
MCHC: 33.1 g/dL (ref 32.0–36.0)
MCV: 89.1 fL (ref 80.0–100.0)
PLATELETS: 286 10*3/uL (ref 150–440)
RBC: 4.31 MIL/uL (ref 3.80–5.20)
RDW: 14 % (ref 11.5–14.5)
WBC: 8.4 10*3/uL (ref 3.6–11.0)

## 2017-03-22 LAB — BASIC METABOLIC PANEL
Anion gap: 10 (ref 5–15)
BUN: 24 mg/dL — AB (ref 6–20)
CO2: 24 mmol/L (ref 22–32)
Calcium: 8.7 mg/dL — ABNORMAL LOW (ref 8.9–10.3)
Chloride: 105 mmol/L (ref 101–111)
Creatinine, Ser: 0.89 mg/dL (ref 0.44–1.00)
GFR calc Af Amer: >60 mL/min (ref >60)
GLUCOSE: 107 mg/dL — AB (ref 65–99)
Potassium: 3.7 mmol/L (ref 3.5–5.1)
Sodium: 139 mmol/L (ref 135–145)

## 2017-03-22 MED ORDER — ACETAMINOPHEN 325 MG PO TABS
650.0000 mg | ORAL_TABLET | Freq: Four times a day (QID) | ORAL | Status: DC | PRN
  Administered 2017-03-24 – 2017-03-25 (×3): 650 mg via ORAL
  Filled 2017-03-22 (×3): qty 2

## 2017-03-22 MED ORDER — ONDANSETRON HCL 4 MG/2ML IJ SOLN
4.0000 mg | Freq: Four times a day (QID) | INTRAMUSCULAR | Status: DC | PRN
  Administered 2017-03-23 (×2): 4 mg via INTRAVENOUS
  Filled 2017-03-22 (×2): qty 2

## 2017-03-22 MED ORDER — FENTANYL CITRATE (PF) 100 MCG/2ML IJ SOLN
INTRAMUSCULAR | Status: AC
  Filled 2017-03-22: qty 2

## 2017-03-22 MED ORDER — HYDROCODONE-ACETAMINOPHEN 5-325 MG PO TABS
1.0000 | ORAL_TABLET | ORAL | Status: DC | PRN
  Administered 2017-03-22 – 2017-03-23 (×3): 2 via ORAL
  Administered 2017-03-24: 1 via ORAL
  Filled 2017-03-22: qty 2
  Filled 2017-03-22: qty 1
  Filled 2017-03-22 (×2): qty 2

## 2017-03-22 MED ORDER — ACETAMINOPHEN 325 MG PO TABS
650.0000 mg | ORAL_TABLET | Freq: Once | ORAL | Status: AC
  Administered 2017-03-22: 650 mg via ORAL
  Filled 2017-03-22: qty 2

## 2017-03-22 MED ORDER — HYDRALAZINE HCL 20 MG/ML IJ SOLN: 10.0000 mg | INTRAMUSCULAR | Status: DC | PRN

## 2017-03-22 MED ORDER — HEPARIN SODIUM (PORCINE) 5000 UNIT/ML IJ SOLN
5000.0000 [IU] | Freq: Three times a day (TID) | INTRAMUSCULAR | Status: DC
  Administered 2017-03-22 – 2017-03-23 (×2): 5000 [IU] via SUBCUTANEOUS
  Filled 2017-03-22 (×2): qty 1

## 2017-03-22 MED ORDER — ONDANSETRON HCL 4 MG PO TABS: 4.0000 mg | ORAL_TABLET | Freq: Four times a day (QID) | ORAL | Status: DC | PRN

## 2017-03-22 MED ORDER — BISACODYL 10 MG RE SUPP: 10.0000 mg | Freq: Every day | RECTAL | Status: DC | PRN

## 2017-03-22 MED ORDER — DEXTROSE IN LACTATED RINGERS 5 % IV SOLN
INTRAVENOUS | Status: AC
  Administered 2017-03-22 – 2017-03-23 (×2): via INTRAVENOUS

## 2017-03-22 MED ORDER — ACETAMINOPHEN 650 MG RE SUPP
650.0000 mg | Freq: Four times a day (QID) | RECTAL | Status: DC | PRN
Start: 2017-03-22 — End: 2017-03-25

## 2017-03-22 MED ORDER — CLINDAMYCIN PHOSPHATE 600 MG/50ML IV SOLN
600.0000 mg | INTRAVENOUS | Status: AC
  Administered 2017-03-23 (×2): 600 mg via INTRAVENOUS
  Filled 2017-03-22: qty 50

## 2017-03-22 MED ORDER — MEMANTINE HCL 5 MG PO TABS: 10.0000 mg | ORAL_TABLET | Freq: Two times a day (BID) | ORAL | Status: DC

## 2017-03-22 MED ORDER — POLYETHYLENE GLYCOL 3350 17 G PO PACK
17.0000 g | PACK | Freq: Every day | ORAL | Status: DC | PRN
  Administered 2017-03-24: 17 g via ORAL
  Filled 2017-03-22: qty 1

## 2017-03-22 MED ORDER — INFLUENZA VAC SPLIT HIGH-DOSE 0.5 ML IM SUSY
0.5000 mL | PREFILLED_SYRINGE | INTRAMUSCULAR | Status: AC
  Administered 2017-03-24: 0.5 mL via INTRAMUSCULAR
  Filled 2017-03-22 (×2): qty 0.5

## 2017-03-22 MED ORDER — CITALOPRAM HYDROBROMIDE 20 MG PO TABS: 10.0000 mg | ORAL_TABLET | ORAL | Status: DC

## 2017-03-22 MED ORDER — DOCUSATE SODIUM 100 MG PO CAPS
100.0000 mg | ORAL_CAPSULE | Freq: Two times a day (BID) | ORAL | Status: DC
  Administered 2017-03-22 – 2017-03-25 (×5): 100 mg via ORAL
  Filled 2017-03-22 (×5): qty 1

## 2017-03-22 MED ORDER — FENTANYL CITRATE (PF) 100 MCG/2ML IJ SOLN
25.0000 ug | Freq: Once | INTRAMUSCULAR | Status: AC
  Administered 2017-03-22: 25 ug via INTRAVENOUS

## 2017-03-22 NOTE — H&P (Signed)
[employer] -  at [facility]   PATIENT NAME: Katie Fitzgerald    MR#:  [id number]  DATE OF BIRTH:  05/29/1940  DATE OF ADMISSION:  03/22/2017  PRIMARY CARE PHYSICIAN: Patient, No Pcp Per   REQUESTING/REFERRING PHYSICIAN:   CHIEF COMPLAINT:   Chief Complaint  Patient presents with  . Fall    HISTORY OF PRESENT ILLNESS: Katie Fitzgerald  is a 77 y.o. female with a known history per below presents to emergency room status post mechanical fall/tripping over dog, ER workup noted for right hip fracture, patient also complaining of left knee pain-CT of the knee is pending at this time, patient evaluated at the bedside, daughter present who is the primary caregiver, patient now being admitted for acute right hip fracture, acute left knee pain status post mechanical fall.  PAST MEDICAL HISTORY:   Past Medical History:  Diagnosis Date  . Dementia   . Hypertension   . Osteoarthrosis     PAST SURGICAL HISTORY:  Past Surgical History:  Procedure Laterality Date  . KNEE SURGERY    . Open reduction internal fixation of the left olecranon fracture Left   . POSTERIOR LUMBAR SPINE FUSION ONE LEVEL      SOCIAL HISTORY:  Social History   Tobacco Use  . Smoking status: Never Smoker  . Smokeless tobacco: Never Used  Substance Use Topics  . Alcohol use: No    FAMILY HISTORY:  Family History  Problem Relation Age of Onset  . Dementia Mother   . Stroke Father     DRUG ALLERGIES:  Allergies  Allergen Reactions  . Amoxicillin     Other reaction(s): Unknown  . Penicillin G     Other reaction(s): Unknown    REVIEW OF SYSTEMS:  Poor historian due to dementia  CONSTITUTIONAL: No fever, fatigue or weakness.  EYES: No blurred or double vision.  EARS, NOSE, AND THROAT: No tinnitus or ear pain.  RESPIRATORY: No cough, shortness of breath, wheezing or hemoptysis.  CARDIOVASCULAR: No chest pain, orthopnea, edema.  GASTROINTESTINAL: No nausea, vomiting, diarrhea or  abdominal pain.  GENITOURINARY: No dysuria, hematuria.  ENDOCRINE: No polyuria, nocturia,  HEMATOLOGY: No anemia, easy bruising or bleeding SKIN: No rash or lesion. MUSCULOSKELETAL: Right hip pain, left knee pain    NEUROLOGIC: No tingling, numbness, weakness.  PSYCHIATRY: No anxiety or depression.   MEDICATIONS AT HOME:  Prior to Admission medications   Medication Sig Start Date End Date Taking? Authorizing Provider  citalopram (CELEXA) 20 MG tablet Take 10-20 mg by mouth See admin instructions. Take 1/2 tablet (10mg ) by mouth daily for 1 week, then increase to 1 tablet by mouth every day. 04/27/15   [provider]  memantine (NAMENDA) 10 MG tablet Take 10 mg by mouth 2 (two) times daily. 04/15/15   [provider]      PHYSICAL EXAMINATION:   VITAL SIGNS: Blood pressure (!) 165/65, pulse 80, temperature 98.7 F (37.1 C), temperature source Oral, resp. rate 18, height 5\' 4"  (1.626 m), weight 59 kg (130 lb), SpO2 98 %.  GENERAL:  77 y.o.-year-old patient lying in the bed with no acute distress.  Frail-appearing EYES: Pupils equal, round, reactive to light and accommodation. No scleral icterus. Extraocular muscles intact.  HEENT: Head atraumatic, normocephalic. Oropharynx and nasopharynx clear.  NECK:  Supple, no jugular venous distention. No thyroid enlargement, no tenderness.  LUNGS: Normal breath sounds bilaterally, no wheezing, rales,rhonchi or crepitation. No use of accessory muscles of respiration.  CARDIOVASCULAR: S1, S2  normal. No murmurs, rubs, or gallops.  ABDOMEN: Soft, nontender, nondistended. Bowel sounds present. No organomegaly or mass.  EXTREMITIES: No pedal edema, cyanosis, or clubbing.  NEUROLOGIC: Cranial nerves II through XII are intact. MAES. Gait not checked.  PSYCHIATRIC: The patient is alert and oriented x 3.  SKIN: No obvious rash, lesion, or ulcer.   LABORATORY PANEL:   CBC Recent Labs  Lab 03/22/17 1940  WBC 8.4  HGB 12.7  HCT 38.4   PLT 286  MCV 89.1  MCH 29.5  MCHC 33.1  RDW 14.0   ------------------------------------------------------------------------------------------------------------------  Chemistries  Recent Labs  Lab 03/22/17 1940  NA 139  K 3.7  CL 105  CO2 24  GLUCOSE 107*  BUN 24*  CREATININE 0.89  CALCIUM 8.7*   ------------------------------------------------------------------------------------------------------------------ estimated creatinine clearance is 47.2 mL/min (by C-G formula based on SCr of 0.89 mg/dL). ------------------------------------------------------------------------------------------------------------------ No results for input(s): TSH, T4TOTAL, T3FREE, THYROIDAB in the last 72 hours.  Invalid input(s): FREET3   Coagulation profile No results for input(s): INR, PROTIME in the last 168 hours. ------------------------------------------------------------------------------------------------------------------- No results for input(s): DDIMER in the last 72 hours. -------------------------------------------------------------------------------------------------------------------  Cardiac Enzymes No results for input(s): CKMB, TROPONINI, MYOGLOBIN in the last 168 hours.  Invalid input(s): CK ------------------------------------------------------------------------------------------------------------------ Invalid input(s): POCBNP  ---------------------------------------------------------------------------------------------------------------  Urinalysis    Component Value Date/Time   COLORURINE YELLOW (A) 05/10/2015 0910   APPEARANCEUR CLOUDY (A) 05/10/2015 0910   LABSPEC 1.014 05/10/2015 0910   PHURINE 8.0 05/10/2015 0910   GLUCOSEU NEGATIVE 05/10/2015 0910   HGBUR 1+ (A) 05/10/2015 0910   BILIRUBINUR NEGATIVE 05/10/2015 0910   KETONESUR NEGATIVE 05/10/2015 0910   PROTEINUR NEGATIVE 05/10/2015 0910   NITRITE NEGATIVE 05/10/2015 0910   LEUKOCYTESUR NEGATIVE  05/10/2015 0910     RADIOLOGY: Dg Pelvis 1-2 Views  Result Date: 03/22/2017 CLINICAL DATA:  Pain after trauma EXAM: PELVIS - 1-2 VIEW COMPARISON:  None. FINDINGS: There is a subcapital fracture in the right hip without dislocation identified. There is a healed left inferior pubic ramus fracture. Left hip is grossly intact. Surgical hardware seen in the lower lumbar spine. IMPRESSION: 1. Subcapital right hip fracture. Electronically Signed   By: Gerome Sam III M.D   On: 03/22/2017 20:35   Dg Knee Complete 4 Views Left  Result Date: 03/22/2017 CLINICAL DATA:  Pain after fall EXAM: LEFT KNEE - COMPLETE 4+ VIEW COMPARISON:  None. FINDINGS: No fracture or dislocation. No joint effusion. Tricompartmental degenerative changes, most marked in the medial compartment. IMPRESSION: Degenerative changes.  No fracture or dislocation. Electronically Signed   By: Gerome Sam III M.D   On: 03/22/2017 20:34   Dg Femur Min 2 Views Right  Result Date: 03/22/2017 CLINICAL DATA:  Bilateral knee pain after fall.  Right femoral pain. EXAM: RIGHT FEMUR 2 VIEWS COMPARISON:  Pelvis film April 29, 2015 FINDINGS: There is a subcapital fracture of the right hip. No other femoral fractures identified. Severe degenerative changes in the right knee. IMPRESSION: Subcapital fracture of the right hip. Electronically Signed   By: Gerome Sam III M.D   On: 03/22/2017 20:33    EKG: Orders placed or performed during the hospital encounter of 04/29/15  . ED EKG  . ED EKG  . EKG 12-Lead  . EKG 12-Lead    IMPRESSION AND PLAN: 1 acute right hip fracture status post mechanical fall Admit to regular nursing floor bed, orthopedic surgery to evaluate/treat-tentative plans for repair on tomorrow Patient is cleared for operative intervention, risk of acute cardiac death/acute cardiac  arrest/acute stroke less than 2% based on revised Goldman criteria, adult pain protocol  2 chronic dementia Stable Continue home  regiment  3 chronic benign essential hypertension Exacerbated by above PRN hydralazine IV for systolic blood pressure greater than 160  4 chronic osteoarthritis Stable  Full code Condition stable Disposition to inpatient rehab in 3-5 days   All the records are reviewed and case discussed with ED provider. Management plans discussed with the patient, family and they are in agreement.  CODE STATUS: Code Status History    Date Active Date Inactive Code Status Order ID Comments User Context   04/29/2015 15:51 05/01/2015 19:21 Full Code [id number]  Shaune Pollack, MD Inpatient    Advance Directive Documentation     Most Recent Value  Type of Advance Directive  Healthcare Power of Attorney  Pre-existing out of facility DNR order (yellow form or pink MOST form)  No data  "MOST" Form in Place?  No data       TOTAL TIME TAKING CARE OF THIS PATIENT: 45 minutes.    Evelena Asa Salary M.D on 03/22/2017   Between 7am to 6pm - Pager - [phone]  After 6pm go to [email or web address] - password [email or web address]  [facility]  Office  [phone]  CC: Primary care physician; Patient, No Pcp Per   Note: This dictation was prepared with Dragon dictation along with smaller phrase technology. Any transcriptional errors that result from this process are unintentional.

## 2017-03-22 NOTE — ED Notes (Signed)
Bilateral knee pain after fall in the grass this evening. Pt co pain more to the left pain. CMS intact.

## 2017-03-22 NOTE — ED Notes (Signed)
Patient transported to CT 

## 2017-03-22 NOTE — ED Provider Notes (Signed)
[facility] Emergency Department Provider Note   ____________________________________________   First MD Initiated Contact with Patient 03/22/17 1935     (approximate)  I have reviewed the triage vital signs and the nursing notes.   HISTORY  Chief Complaint Fall  EM caveat: Dementia limits HPI/review of systems  HPI Katie Fitzgerald is a 77 y.o. female of dementia, otherwise very healthy.  Patient's daughter is with her, reports that the patient lives with her.  She was with the patient when the patient was walking and tripped over the family dog.  She fell onto her right side and as she fell she twisted also landing onto her left knee.  Since the fall she is been complaining of pain over the right hip and also the left knee.  She did not strike her head and her daughter witnessed the entire event.  Patient has baseline confusion, and is her normal state at this time.   Past Medical History:  Diagnosis Date  . Dementia   . Hypertension   . Osteoarthrosis     Patient Active Problem List   Diagnosis Date Noted  . Syncope 04/29/2015  . Fracture of left inferior pubic ramus (HCC) 04/29/2015    Past Surgical History:  Procedure Laterality Date  . KNEE SURGERY    . Open reduction internal fixation of the left olecranon fracture Left   . POSTERIOR LUMBAR SPINE FUSION ONE LEVEL      Prior to Admission medications   Medication Sig Start Date End Date Taking? Authorizing Provider  citalopram (CELEXA) 20 MG tablet Take 10-20 mg by mouth See admin instructions. Take 1/2 tablet (10mg ) by mouth daily for 1 week, then increase to 1 tablet by mouth every day. 04/27/15   [provider]  docusate sodium (COLACE) 100 MG capsule Take 1 capsule (100 mg total) by mouth 2 (two) times daily as needed for mild constipation. 05/01/15   Hower, Cletis [place], MD  HYDROcodone-acetaminophen (NORCO/VICODIN) 5-325 MG tablet Take 1-2 tablets by mouth every 4 (four) hours  as needed for moderate pain. 05/01/15   Hower, Cletis [place], MD  memantine (NAMENDA) 10 MG tablet Take 10 mg by mouth 2 (two) times daily. 04/15/15   [provider]    Allergies Amoxicillin and Penicillin g  Family History  Problem Relation Age of Onset  . Dementia Mother   . Stroke Father     Social History Social History   Tobacco Use  . Smoking status: Never Smoker  . Smokeless tobacco: Never Used  Substance Use Topics  . Alcohol use: No  . Drug use: No    Review of Systems -per daughter, Constitutional: Ms., acting normally.  Was out walking when tripping over the dog Eyes: No visual changes. ENT: No sore throat. Cardiovascular: Denies chest pain. Respiratory: Denies shortness of breath. Gastrointestinal: No abdominal pain.   Musculoskeletal: Negative for back pain.  Reporting pain in her right hip area and also over her left knee. Skin: Negative for rash. Neurological: Negative for new weakness.  No changes in speech or confusion.    ____________________________________________   PHYSICAL EXAM:  VITAL SIGNS: ED Triage Vitals  Enc Vitals Group     BP 03/22/17 1933 (!) 165/65     Pulse Rate 03/22/17 1933 80     Resp 03/22/17 1933 18     Temp 03/22/17 1933 98.7 F (37.1 C)     Temp Source 03/22/17 1933 Oral     SpO2 03/22/17 1933  98 %     Weight 03/22/17 1935 130 lb (59 kg)     Height 03/22/17 1935 5\' 4"  (1.626 m)     Head Circumference --      Peak Flow --      Pain Score 03/22/17 1934 6     Pain Loc --      Pain Edu? --      Excl. in GC? --     Constitutional: Alert and oriented to daughter and self, but not well oriented to location or situation. Well appearing and in no acute distress.  Pleasantly confused. Eyes: Conjunctivae are normal. Head: Atraumatic. Nose: No congestion/rhinnorhea. Mouth/Throat: Mucous membranes are moist. Neck: No stridor.  No midline cervical tenderness.  No thoracic or lumbar tenderness or step-offs. Cardiovascular:  Normal rate, regular rhythm. Grossly normal heart sounds.  Good peripheral circulation. Respiratory: Normal respiratory effort.  No retractions. Lungs CTAB. Gastrointestinal: Soft and nontender. No distention. Musculoskeletal:   RIGHT Right upper extremity demonstrates normal strength, good use of all muscles. No edema bruising or contusions of the right shoulder/upper arm, right elbow, right forearm / hand. Full range of motion of the right right upper extremity without pain. No evidence of trauma. Strong radial pulse. Intact median/ulnar/radial neuro-muscular exam.  LEFT Left upper extremity demonstrates normal strength, good use of all muscles. No edema bruising or contusions of the left shoulder/upper arm, left elbow, left forearm / hand. Full range of motion of the left  upper extremity without pain. No evidence of trauma. Strong radial pulse. Intact median/ulnar/radial neuro-muscular exam.  Lower Extremities  No edema. Normal DP/PT pulses bilateral with good cap refill.  Normal neuro-motor function lower extremities bilateral.  RIGHT Right lower extremity demonstrates normal strength, good use of all muscles except she does report some pain in the region of the right hip/right trochanter region when ranging and with axial loading.. No edema bruising or contusions of the right right knee, right ankle. Full range of motion of the right lower extremity without pain except about the right hip.   LEFT Left lower extremity demonstrates normal strength, good use of all muscles. No edema bruising or contusions of the hip,  knee, ankle. Full range of motion of the left lower extremity without pain except she does report pain over the left anterior knee to palpation without deformity or abnormality noted by visual inspection. No pain on axial loading.    Neurologic:  Normal speech and language. No gross focal neurologic deficits are appreciated.  Skin:  Skin is warm, dry and intact. No rash  noted. Psychiatric: Mood and affect are somewhat flat.  Speech and behavior are normal to slightly flat.  ____________________________________________   LABS (all labs ordered are listed, but only abnormal results are displayed)  Labs Reviewed  BASIC METABOLIC PANEL - Abnormal; Notable for the following components:      Result Value   Glucose, Bld 107 (*)    BUN 24 (*)    Calcium 8.7 (*)    All other components within normal limits  CBC   ____________________________________________  EKG   ____________________________________________  RADIOLOGY   X-rays of the pelvis right hip and left knee are reviewed.  Most notable for a acute fracture of the right hip ____________________________________________   PROCEDURES  Procedure(s) performed: None  Procedures  Critical Care performed: No  ____________________________________________   INITIAL IMPRESSION / ASSESSMENT AND PLAN / ED COURSE  Pertinent labs & imaging results that were available during my care of the patient  were reviewed by me and considered in my medical decision making (see chart for details).  Patient presents for evaluation after a fall.  It was witnessed by her daughter.  She has dementia which limits her history somewhat but clearly witnessed and she did not have a head injury no evidence of she does report pain over the left knee, though her exam does not suggest any obvious deformity or injury is likely falling onto her anterior knee joint without notable injury except suspect contusion.  However, she also complains of right hip pain, will obtain x-rays of the right hip, pelvis, and left knee.  Provide Tylenol.  Basic labs including CBC and metabolic panel.  Daughter states in normal state of health and normal mental status for the patient given her dementia.  Does not appear there is an associated acute medical etiology of fall, clearly witnessed to trip over the dog.  No cardiac or pulmonary  symptoms.      ----------------------------------------- 9:11 PM on 03/22/2017 -----------------------------------------  Case and care discussed with Dr. Martha ClanKrasinski of orthopedics.  He will see patient in consult, hospitalist Dr. Katheren ShamsSalary informed of admission and will perform this.  Also did noted to Dr. Martha ClanKrasinski patient complaining of right hip pain with a noted fracture, but also complaining of left knee pain anteriorly.  He advises obtaining a CT of the left knee as well, we will obtain this for orthopedics follow-up.  Patient and family including daughter updated on plan.  Agreeable with plan for admission.  Her pain is now beginning to increase about the right hip, I have ordered a moderate dose of fentanyl for pain relief at this time.  Continue to monitor patient closely.  Fully awake and alert  ____________________________________________   FINAL CLINICAL IMPRESSION(S) / ED DIAGNOSES  Final diagnoses:  Closed right hip fracture, initial encounter (HCC)  Acute pain of left knee      NEW MEDICATIONS STARTED DURING THIS VISIT:  New Prescriptions   No medications on file     Note:  This document was prepared using Dragon voice recognition software and may include unintentional dictation errors.     Sharyn CreamerQuale, Louellen Haldeman, MD 03/22/17 2112

## 2017-03-22 NOTE — ED Notes (Signed)
Patient transported to X-ray 

## 2017-03-22 NOTE — ED Triage Notes (Signed)
EMS pt to Rm 18H with co knocked down by her dog. Pt has bilateral knee pain. Pt ambulatory after the fall but co -pain to both knees.

## 2017-03-22 NOTE — ED Notes (Signed)
Notified Nurse Dewayne HatchAnn of assigned bed.

## 2017-03-23 ENCOUNTER — Inpatient Hospital Stay: Payer: Medicare Other

## 2017-03-23 ENCOUNTER — Inpatient Hospital Stay: Payer: Medicare Other | Admitting: Anesthesiology

## 2017-03-23 ENCOUNTER — Encounter
Admission: EM | Disposition: A | Discharge status: Skilled Nursing Facility | Payer: Self-pay | Source: Home / Self Care | Attending: Internal Medicine

## 2017-03-23 ENCOUNTER — Encounter: Payer: Self-pay | Admitting: Anesthesiology

## 2017-03-23 HISTORY — PX: HIP PINNING,CANNULATED: SHX1758

## 2017-03-23 LAB — BASIC METABOLIC PANEL
ANION GAP: 7 (ref 5–15)
BUN: 18 mg/dL (ref 6–20)
CALCIUM: 8.6 mg/dL — AB (ref 8.9–10.3)
CO2: 24 mmol/L (ref 22–32)
CREATININE: 0.75 mg/dL (ref 0.44–1.00)
Chloride: 107 mmol/L (ref 101–111)
GFR calc Af Amer: >60 mL/min (ref >60)
GFR calc non Af Amer: >60 mL/min (ref >60)
GLUCOSE: 145 mg/dL — AB (ref 65–99)
Potassium: 3.9 mmol/L (ref 3.5–5.1)
Sodium: 138 mmol/L (ref 135–145)

## 2017-03-23 LAB — MRSA PCR SCREENING: MRSA BY PCR: NEGATIVE

## 2017-03-23 LAB — CBC
HCT: 37.1 % (ref 35.0–47.0)
HEMOGLOBIN: 12.2 g/dL (ref 12.0–16.0)
MCH: 29.3 pg (ref 26.0–34.0)
MCHC: 32.8 g/dL (ref 32.0–36.0)
MCV: 89.3 fL (ref 80.0–100.0)
PLATELETS: 260 10*3/uL (ref 150–440)
RBC: 4.16 MIL/uL (ref 3.80–5.20)
RDW: 14.1 % (ref 11.5–14.5)
WBC: 8.6 10*3/uL (ref 3.6–11.0)

## 2017-03-23 SURGERY — FIXATION, FEMUR, NECK, PERCUTANEOUS, USING SCREW
Anesthesia: General | Laterality: Right

## 2017-03-23 MED ORDER — PHENOL 1.4 % MT LIQD
1.0000 | OROMUCOSAL | Status: DC | PRN
  Filled 2017-03-23: qty 177

## 2017-03-23 MED ORDER — MENTHOL 3 MG MT LOZG
1.0000 | LOZENGE | OROMUCOSAL | Status: DC | PRN
  Filled 2017-03-23: qty 9

## 2017-03-23 MED ORDER — NEOMYCIN-POLYMYXIN B GU 40-200000 IR SOLN
Status: DC | PRN
  Administered 2017-03-23: 4 mL

## 2017-03-23 MED ORDER — SODIUM CHLORIDE 0.9 % IV SOLN
75.0000 mL/h | INTRAVENOUS | Status: DC
  Administered 2017-03-23 – 2017-03-24 (×2): 75 mL/h via INTRAVENOUS

## 2017-03-23 MED ORDER — MAGNESIUM CITRATE PO SOLN
1.0000 | Freq: Once | ORAL | Status: DC | PRN
  Filled 2017-03-23: qty 296

## 2017-03-23 MED ORDER — CLINDAMYCIN PHOSPHATE 600 MG/50ML IV SOLN
600.0000 mg | Freq: Four times a day (QID) | INTRAVENOUS | Status: AC
  Administered 2017-03-23 – 2017-03-24 (×2): 600 mg via INTRAVENOUS
  Filled 2017-03-23 (×2): qty 50

## 2017-03-23 MED ORDER — LIDOCAINE HCL 1 % IJ SOLN
INTRAMUSCULAR | Status: DC | PRN
  Administered 2017-03-23: 10 mL

## 2017-03-23 MED ORDER — SENNA 8.6 MG PO TABS
1.0000 | ORAL_TABLET | Freq: Two times a day (BID) | ORAL | Status: DC
  Administered 2017-03-23 – 2017-03-25 (×4): 8.6 mg via ORAL
  Filled 2017-03-23 (×4): qty 1

## 2017-03-23 MED ORDER — LIDOCAINE HCL (PF) 1 % IJ SOLN
INTRAMUSCULAR | Status: AC
  Filled 2017-03-23: qty 30

## 2017-03-23 MED ORDER — FENTANYL CITRATE (PF) 100 MCG/2ML IJ SOLN
25.0000 ug | INTRAMUSCULAR | Status: DC | PRN
  Administered 2017-03-23 (×3): 25 ug via INTRAVENOUS

## 2017-03-23 MED ORDER — FENTANYL CITRATE (PF) 100 MCG/2ML IJ SOLN
INTRAMUSCULAR | Status: AC
  Filled 2017-03-23: qty 2

## 2017-03-23 MED ORDER — BUPIVACAINE HCL (PF) 0.5 % IJ SOLN
INTRAMUSCULAR | Status: AC
Start: 2017-03-23 — End: 2017-03-23
  Filled 2017-03-23: qty 30

## 2017-03-23 MED ORDER — MORPHINE SULFATE (PF) 2 MG/ML IV SOLN: 2.0000 mg | INTRAVENOUS | Status: DC | PRN

## 2017-03-23 MED ORDER — BUPIVACAINE HCL (PF) 0.5 % IJ SOLN
INTRAMUSCULAR | Status: DC | PRN
  Administered 2017-03-23: 10 mL

## 2017-03-23 MED ORDER — FENTANYL CITRATE (PF) 100 MCG/2ML IJ SOLN
INTRAMUSCULAR | Status: DC | PRN
  Administered 2017-03-23 (×2): 50 ug via INTRAVENOUS

## 2017-03-23 MED ORDER — ALUM & MAG HYDROXIDE-SIMETH 200-200-20 MG/5ML PO SUSP: 30.0000 mL | ORAL | Status: DC | PRN

## 2017-03-23 MED ORDER — LIDOCAINE HCL (CARDIAC) 20 MG/ML IV SOLN
INTRAVENOUS | Status: DC | PRN
  Administered 2017-03-23: 50 mg via INTRAVENOUS

## 2017-03-23 MED ORDER — ONDANSETRON HCL 4 MG/2ML IJ SOLN: 4.0000 mg | Freq: Once | INTRAMUSCULAR | Status: DC | PRN

## 2017-03-23 MED ORDER — ENOXAPARIN SODIUM 40 MG/0.4ML ~~LOC~~ SOLN
40.0000 mg | SUBCUTANEOUS | Status: DC
  Administered 2017-03-24 – 2017-03-25 (×2): 40 mg via SUBCUTANEOUS
  Filled 2017-03-23 (×2): qty 0.4

## 2017-03-23 MED ORDER — PROPOFOL 10 MG/ML IV BOLUS
INTRAVENOUS | Status: DC | PRN
  Administered 2017-03-23: 100 mg via INTRAVENOUS

## 2017-03-23 MED ORDER — LACTATED RINGERS IV SOLN
INTRAVENOUS | Status: DC | PRN
  Administered 2017-03-23: 14:00:00 via INTRAVENOUS

## 2017-03-23 SURGICAL SUPPLY — 33 items
BIT DRILL CANNULATED 5.0 (BIT) ×3 IMPLANT
BLADE SURG SZ10 CARB STEEL (BLADE) ×3 IMPLANT
BNDG COHESIVE 6X5 TAN STRL LF (GAUZE/BANDAGES/DRESSINGS) ×3 IMPLANT
CANISTER SUCT 1200ML W/VALVE (MISCELLANEOUS) ×3 IMPLANT
DRAPE SURG 17X11 SM STRL (DRAPES) ×6 IMPLANT
DRAPE U-SHAPE 47X51 STRL (DRAPES) ×3 IMPLANT
DRSG OPSITE POSTOP 3X4 (GAUZE/BANDAGES/DRESSINGS) IMPLANT
DRSG OPSITE POSTOP 4X6 (GAUZE/BANDAGES/DRESSINGS) ×6 IMPLANT
DURAPREP 26ML APPLICATOR (WOUND CARE) ×6 IMPLANT
ELECT REM PT RETURN 9FT ADLT (ELECTROSURGICAL) ×3
ELECTRODE REM PT RTRN 9FT ADLT (ELECTROSURGICAL) ×1 IMPLANT
GAUZE SPONGE 4X4 12PLY STRL (GAUZE/BANDAGES/DRESSINGS) IMPLANT
GLOVE BIOGEL PI IND STRL 9 (GLOVE) ×1 IMPLANT
GLOVE BIOGEL PI INDICATOR 9 (GLOVE) ×2
GLOVE SURG 9.0 ORTHO LTXF (GLOVE) ×12 IMPLANT
GOWN STRL REUS TWL 2XL XL LVL4 (GOWN DISPOSABLE) ×3 IMPLANT
GOWN STRL REUS W/ TWL LRG LVL3 (GOWN DISPOSABLE) ×1 IMPLANT
GOWN STRL REUS W/TWL LRG LVL3 (GOWN DISPOSABLE) ×2
GUIDEPIN 3.2MM DIA 12INL (PIN) ×9 IMPLANT
HOLDER FOLEY CATH W/STRAP (MISCELLANEOUS) IMPLANT
MAT BLUE FLOOR 46X72 FLO (MISCELLANEOUS) ×3 IMPLANT
NS IRRIG 1000ML POUR BTL (IV SOLUTION) ×3 IMPLANT
PACK HIP COMPR (MISCELLANEOUS) ×3 IMPLANT
SCREW CANN RVRS CUT FLT 80X16X (Screw) ×2 IMPLANT
SCREW CANN RVRS CUT FLT 85X16 (Screw) ×1 IMPLANT
SCREW CANNULATED 6.5X80MM (Screw) ×4 IMPLANT
SCREW CANNULATED 6.5X85MM (Screw) ×2 IMPLANT
STAPLER SKIN PROX 35W (STAPLE) ×3 IMPLANT
STRAP SAFETY 5IN WIDE (MISCELLANEOUS) ×3 IMPLANT
SUT VIC AB 0 CT1 36 (SUTURE) ×3 IMPLANT
SUT VIC AB 2-0 CT2 27 (SUTURE) ×3 IMPLANT
SUT VICRYL 0 AB UR-6 (SUTURE) ×6 IMPLANT
TRAY FOLEY W/METER SILVER 16FR (SET/KITS/TRAYS/PACK) IMPLANT

## 2017-03-23 NOTE — Anesthesia Procedure Notes (Signed)
Procedure Name: LMA Insertion Date/Time: 03/23/2017 1:50 PM Performed by: Eduardo OsierKilduff, Siah Kannan, CRNA Pre-anesthesia Checklist: Patient identified, Emergency Drugs available, Timeout performed, Patient being monitored and Suction available Oxygen Delivery Method: Circle system utilized Preoxygenation: Pre-oxygenation with 100% oxygen Induction Type: IV induction LMA: LMA inserted LMA Size: 4.0 Tube type: Oral Number of attempts: 1 Placement Confirmation: breath sounds checked- equal and bilateral,  CO2 detector and positive ETCO2 Tube secured with: Tape

## 2017-03-23 NOTE — Transfer of Care (Signed)
Immediate Anesthesia Transfer of Care Note  Patient: Katie MainsDianne V Bellerose  Procedure(s) Performed: CANNULATED HIP PINNING (Right )  Patient Location: PACU  Anesthesia Type:General  Level of Consciousness: awake  Airway & Oxygen Therapy: Patient Spontanous Breathing and Patient connected to nasal cannula oxygen  Post-op Assessment: Report given to RN  Post vital signs: Reviewed and stable  Last Vitals:  Vitals:   03/23/17 1444 03/23/17 1446  BP:  133/67  Pulse:  80  Resp:  12  Temp: (!) (P) 36.3 C (!) 36.4 C  SpO2:  99%    Last Pain:  Vitals:   03/23/17 0811  TempSrc: Oral  PainSc:          Complications: No apparent anesthesia complications

## 2017-03-23 NOTE — Progress Notes (Signed)
[employer] [facility] [place] [facility]   PATIENT NAME: Katie Fitzgerald    MR#:  [id number]  DATE OF BIRTH:  1940/02/13  SUBJECTIVE:  CHIEF COMPLAINT:   Chief Complaint  Patient presents with  . Fall    Came after accidental fall, hip fracture.  REVIEW OF SYSTEMS:  CONSTITUTIONAL: No fever, fatigue or weakness.  EYES: No blurred or double vision.  EARS, NOSE, AND THROAT: No tinnitus or ear pain.  RESPIRATORY: No cough, shortness of breath, wheezing or hemoptysis.  CARDIOVASCULAR: No chest pain, orthopnea, edema.  GASTROINTESTINAL: No nausea, vomiting, diarrhea or abdominal pain.  GENITOURINARY: No dysuria, hematuria.  ENDOCRINE: No polyuria, nocturia,  HEMATOLOGY: No anemia, easy bruising or bleeding SKIN: No rash or lesion. MUSCULOSKELETAL: Right hip pain.   NEUROLOGIC: No tingling, numbness, weakness.  PSYCHIATRY: No anxiety or depression.   ROS  DRUG ALLERGIES:   Allergies  Allergen Reactions  . Amoxicillin     Other reaction(s): Unknown  . Penicillin G     Other reaction(s): Unknown    VITALS:  Blood pressure (!) 111/50, pulse 65, temperature (!) 97.5 F (36.4 C), temperature source Oral, resp. rate 18, height 5\' 4"  (1.626 m), weight 51.7 kg (114 lb), SpO2 97 %.  PHYSICAL EXAMINATION:  GENERAL:  77 y.o.-year-old patient lying in the bed with no acute distress.  EYES: Pupils equal, round, reactive to light and accommodation. No scleral icterus. Extraocular muscles intact.  HEENT: Head atraumatic, normocephalic. Oropharynx and nasopharynx clear.  NECK:  Supple, no jugular venous distention. No thyroid enlargement, no tenderness.  LUNGS: Normal breath sounds bilaterally, no wheezing, rales,rhonchi or crepitation. No use of accessory muscles of respiration.  CARDIOVASCULAR: S1, S2 normal. No murmurs, rubs, or gallops.  ABDOMEN: Soft, nontender, nondistended. Bowel sounds present. No organomegaly or mass.  EXTREMITIES: No pedal edema, cyanosis, or  clubbing.  NEUROLOGIC: Cranial nerves II through XII are intact. Muscle strength 5/5 in all extremities except right hip which is painful and she is not moving lower extremity much. Sensation intact. Gait not checked.  PSYCHIATRIC: The patient is alert and oriented x 1. Have baseline dementia. SKIN: No obvious rash, lesion, or ulcer.   Physical Exam LABORATORY PANEL:   CBC Recent Labs  Lab 03/23/17 0910  WBC 8.6  HGB 12.2  HCT 37.1  PLT 260   ------------------------------------------------------------------------------------------------------------------  Chemistries  Recent Labs  Lab 03/23/17 0910  NA 138  K 3.9  CL 107  CO2 24  GLUCOSE 145*  BUN 18  CREATININE 0.75  CALCIUM 8.6*   ------------------------------------------------------------------------------------------------------------------  Cardiac Enzymes No results for input(s): TROPONINI in the last 168 hours. ------------------------------------------------------------------------------------------------------------------  RADIOLOGY:  Dg Pelvis 1-2 Views  Result Date: 03/22/2017 CLINICAL DATA:  Pain after trauma EXAM: PELVIS - 1-2 VIEW COMPARISON:  None. FINDINGS: There is a subcapital fracture in the right hip without dislocation identified. There is a healed left inferior pubic ramus fracture. Left hip is grossly intact. Surgical hardware seen in the lower lumbar spine. IMPRESSION: 1. Subcapital right hip fracture. Electronically Signed   By: Gerome Sam III M.D   On: 03/22/2017 20:35   Ct Knee Left Wo Contrast  Result Date: 03/22/2017 CLINICAL DATA:  Knee pain after fall this evening. Right hip fracture. EXAM: CT OF THE left KNEE WITHOUT CONTRAST TECHNIQUE: Multidetector CT imaging of the left knee was performed according to the standard protocol. Multiplanar CT image reconstructions were also generated. COMPARISON:  None. FINDINGS: No fracture line or displaced fracture fragment identified. Advanced  tricompartmental degenerative changes with associated joint space narrowings and osteophyte formation, most severely involving the medial compartment where there is near abutment of the medial femoral condyle and medial tibial plateau. Anterior cruciate ligament is not adequately visualized to assess integrity. Posterior cruciate ligament is grossly intact. No evidence of medial or lateral collateral ligament tear seen. Joint effusion.  Superficial soft tissues are unremarkable. IMPRESSION: 1. No acute osseous fracture or dislocation. 2. Joint effusion, small to moderate in size, predominantly localized to the suprapatellar bursa, of uncertain chronicity but most likely chronic given the underlying degenerative change. 3. Advanced degenerative osteoarthritis, most severely involving the medial compartment where there is near abutment of the medial femoral condyle and medial tibial plateau. Electronically Signed   By: Bary RichardStan  Maynard M.D.   On: 03/22/2017 22:32   Dg Knee Complete 4 Views Left  Result Date: 03/22/2017 CLINICAL DATA:  Pain after fall EXAM: LEFT KNEE - COMPLETE 4+ VIEW COMPARISON:  None. FINDINGS: No fracture or dislocation. No joint effusion. Tricompartmental degenerative changes, most marked in the medial compartment. IMPRESSION: Degenerative changes.  No fracture or dislocation. Electronically Signed   By: Gerome Samavid  Williams III M.D   On: 03/22/2017 20:34   Dg Hip Port Unilat With Pelvis 1v Right  Result Date: 03/23/2017 CLINICAL DATA:  Right hip repair. EXAM: DG HIP (WITH OR WITHOUT PELVIS) 1V PORT RIGHT COMPARISON:  None. FINDINGS: Three screws been placed across the right sub capital fracture. IMPRESSION: Three screws been placed across the right subcapital fracture. Electronically Signed   By: Gerome Samavid  Williams III M.D   On: 03/23/2017 15:42   Dg Hip Operative Unilat W Or W/o Pelvis Right  Result Date: 03/23/2017 CLINICAL DATA:  Right hip pending FLUOROSCOPY TIME:  39 seconds EXAM: OPERATIVE  RIGHT HIP (WITH PELVIS IF PERFORMED) to VIEWS TECHNIQUE: Fluoroscopic spot image(s) were submitted for interpretation post-operatively. COMPARISON:  March 22, 2017 FINDINGS: Three screws are placed across the subcapital right hip fracture. IMPRESSION: Three screws have been placed across the subcapital right hip fracture. Electronically Signed   By: Gerome Samavid  Williams III M.D   On: 03/23/2017 14:47   Dg Femur Min 2 Views Right  Result Date: 03/22/2017 CLINICAL DATA:  Bilateral knee pain after fall.  Right femoral pain. EXAM: RIGHT FEMUR 2 VIEWS COMPARISON:  Pelvis film April 29, 2015 FINDINGS: There is a subcapital fracture of the right hip. No other femoral fractures identified. Severe degenerative changes in the right knee. IMPRESSION: Subcapital fracture of the right hip. Electronically Signed   By: Gerome Samavid  Williams III M.D   On: 03/22/2017 20:33    ASSESSMENT AND PLAN:   Active Problems:   Hip fx (HCC)  1 acute right hip fracture status post mechanical fall  Plan for surgery today, pain management and DVT prophylaxis as per orthopedic team.   Physical therapy evaluation after surgery and possible rehabilitation placement.  2 chronic dementia Stable Continue home regiment  3 chronic benign essential hypertension Exacerbated by above PRN hydralazine IV for systolic blood pressure greater than 160  4 chronic osteoarthritis Stable    All the records are reviewed and case discussed with Care Management/Social Workerr. Management plans discussed with the patient, family and they are in agreement.  CODE STATUS: full.  TOTAL TIME TAKING CARE OF THIS PATIENT: 35 minutes.   Discussed with her daughter in the room.  POSSIBLE D/C IN 1-2 DAYS, DEPENDING ON CLINICAL CONDITION.   Altamese DillingVaibhavkumar Kazandra Forstrom M.D on 03/23/2017   Between 7am to 6pm - Pager -  [phone]  After 6pm go to [email or web address] - password [email or web address]  [facility] [place] [facility]  Office   [phone]  CC: Primary care physician; Patient, No Pcp Per  Note: This dictation was prepared with Dragon dictation along with smaller phrase technology. Any transcriptional errors that result from this process are unintentional.

## 2017-03-23 NOTE — Progress Notes (Signed)
Patient unable to urinate since this morning . Bladder scan shows 525 ml. Per MD Martha ClanKrasinski, start a foley catheter.

## 2017-03-23 NOTE — Anesthesia Post-op Follow-up Note (Signed)
Anesthesia QCDR form completed.        

## 2017-03-23 NOTE — Progress Notes (Signed)
Subjective:  POST-OP CHECK:  Patient is comfortable in bed. Her daughter lives at the bedside. Patient is confused and has dementia at baseline. Patient reports no right hip pain but her daughter-in-law states that she has still been complaining of knee pain.  Objective:   VITALS:   Vitals:   03/23/17 1545 03/23/17 1608 03/23/17 1727 03/23/17 1844  BP: 133/63 (!) 111/50 (!) 122/57 (!) 109/57  Pulse: 69 65 73 89  Resp: (!) 9 18 18 18   Temp:  (!) 97.5 F (36.4 C) 98.6 F (37 C) 98.6 F (37 C)  TempSrc:  Oral Oral Oral  SpO2: 100% 97% 95% 97%  Weight:      Height:        PHYSICAL EXAM: Right lower extremity: Neurovascular intact Sensation intact distally Intact pulses distally Incision: dressing C/D/I No cellulitis present Compartment soft  LABS  Results for orders placed or performed during the hospital encounter of 03/22/17 (from the past 24 hour(s))  CBC     Status: None   Collection Time: 03/22/17  7:40 PM  Result Value Ref Range   WBC 8.4 3.6 - 11.0 K/uL   RBC 4.31 3.80 - 5.20 MIL/uL   Hemoglobin 12.7 12.0 - 16.0 g/dL   HCT 98.1 19.1 - 47.8 %   MCV 89.1 80.0 - 100.0 fL   MCH 29.5 26.0 - 34.0 pg   MCHC 33.1 32.0 - 36.0 g/dL   RDW 29.5 62.1 - 30.8 %   Platelets 286 150 - 440 K/uL  Basic metabolic panel     Status: Abnormal   Collection Time: 03/22/17  7:40 PM  Result Value Ref Range   Sodium 139 135 - 145 mmol/L   Potassium 3.7 3.5 - 5.1 mmol/L   Chloride 105 101 - 111 mmol/L   CO2 24 22 - 32 mmol/L   Glucose, Bld 107 (H) 65 - 99 mg/dL   BUN 24 (H) 6 - 20 mg/dL   Creatinine, Ser 6.57 0.44 - 1.00 mg/dL   Calcium 8.7 (L) 8.9 - 10.3 mg/dL   GFR calc non Af Amer >60 >60 mL/min   GFR calc Af Amer >60 >60 mL/min   Anion gap 10 5 - 15  MRSA PCR Screening     Status: None   Collection Time: 03/22/17 11:26 PM  Result Value Ref Range   MRSA by PCR NEGATIVE NEGATIVE  Basic metabolic panel     Status: Abnormal   Collection Time: 03/23/17  9:10 AM  Result Value  Ref Range   Sodium 138 135 - 145 mmol/L   Potassium 3.9 3.5 - 5.1 mmol/L   Chloride 107 101 - 111 mmol/L   CO2 24 22 - 32 mmol/L   Glucose, Bld 145 (H) 65 - 99 mg/dL   BUN 18 6 - 20 mg/dL   Creatinine, Ser 8.46 0.44 - 1.00 mg/dL   Calcium 8.6 (L) 8.9 - 10.3 mg/dL   GFR calc non Af Amer >60 >60 mL/min   GFR calc Af Amer >60 >60 mL/min   Anion gap 7 5 - 15  CBC     Status: None   Collection Time: 03/23/17  9:10 AM  Result Value Ref Range   WBC 8.6 3.6 - 11.0 K/uL   RBC 4.16 3.80 - 5.20 MIL/uL   Hemoglobin 12.2 12.0 - 16.0 g/dL   HCT 96.2 95.2 - 84.1 %   MCV 89.3 80.0 - 100.0 fL   MCH 29.3 26.0 - 34.0 pg   MCHC 32.8  32.0 - 36.0 g/dL   RDW 46.9 62.9 - 52.8 %   Platelets 260 150 - 440 K/uL    Dg Pelvis 1-2 Views  Result Date: 03/22/2017 CLINICAL DATA:  Pain after trauma EXAM: PELVIS - 1-2 VIEW COMPARISON:  None. FINDINGS: There is a subcapital fracture in the right hip without dislocation identified. There is a healed left inferior pubic ramus fracture. Left hip is grossly intact. Surgical hardware seen in the lower lumbar spine. IMPRESSION: 1. Subcapital right hip fracture. Electronically Signed   By: Gerome Sam III M.D   On: 03/22/2017 20:35   Ct Knee Left Wo Contrast  Result Date: 03/22/2017 CLINICAL DATA:  Knee pain after fall this evening. Right hip fracture. EXAM: CT OF THE left KNEE WITHOUT CONTRAST TECHNIQUE: Multidetector CT imaging of the left knee was performed according to the standard protocol. Multiplanar CT image reconstructions were also generated. COMPARISON:  None. FINDINGS: No fracture line or displaced fracture fragment identified. Advanced tricompartmental degenerative changes with associated joint space narrowings and osteophyte formation, most severely involving the medial compartment where there is near abutment of the medial femoral condyle and medial tibial plateau. Anterior cruciate ligament is not adequately visualized to assess integrity. Posterior  cruciate ligament is grossly intact. No evidence of medial or lateral collateral ligament tear seen. Joint effusion.  Superficial soft tissues are unremarkable. IMPRESSION: 1. No acute osseous fracture or dislocation. 2. Joint effusion, small to moderate in size, predominantly localized to the suprapatellar bursa, of uncertain chronicity but most likely chronic given the underlying degenerative change. 3. Advanced degenerative osteoarthritis, most severely involving the medial compartment where there is near abutment of the medial femoral condyle and medial tibial plateau. Electronically Signed   By: Bary Richard M.D.   On: 03/22/2017 22:32   Dg Knee Complete 4 Views Left  Result Date: 03/22/2017 CLINICAL DATA:  Pain after fall EXAM: LEFT KNEE - COMPLETE 4+ VIEW COMPARISON:  None. FINDINGS: No fracture or dislocation. No joint effusion. Tricompartmental degenerative changes, most marked in the medial compartment. IMPRESSION: Degenerative changes.  No fracture or dislocation. Electronically Signed   By: Gerome Sam III M.D   On: 03/22/2017 20:34   Dg Hip Port Unilat With Pelvis 1v Right  Result Date: 03/23/2017 CLINICAL DATA:  Right hip repair. EXAM: DG HIP (WITH OR WITHOUT PELVIS) 1V PORT RIGHT COMPARISON:  None. FINDINGS: Three screws been placed across the right sub capital fracture. IMPRESSION: Three screws been placed across the right subcapital fracture. Electronically Signed   By: Gerome Sam III M.D   On: 03/23/2017 15:42   Dg Hip Operative Unilat W Or W/o Pelvis Right  Result Date: 03/23/2017 CLINICAL DATA:  Right hip pending FLUOROSCOPY TIME:  39 seconds EXAM: OPERATIVE RIGHT HIP (WITH PELVIS IF PERFORMED) to VIEWS TECHNIQUE: Fluoroscopic spot image(s) were submitted for interpretation post-operatively. COMPARISON:  March 22, 2017 FINDINGS: Three screws are placed across the subcapital right hip fracture. IMPRESSION: Three screws have been placed across the subcapital right hip  fracture. Electronically Signed   By: Gerome Sam III M.D   On: 03/23/2017 14:47   Dg Femur Min 2 Views Right  Result Date: 03/22/2017 CLINICAL DATA:  Bilateral knee pain after fall.  Right femoral pain. EXAM: RIGHT FEMUR 2 VIEWS COMPARISON:  Pelvis film April 29, 2015 FINDINGS: There is a subcapital fracture of the right hip. No other femoral fractures identified. Severe degenerative changes in the right knee. IMPRESSION: Subcapital fracture of the right hip. Electronically Signed  By: Gerome Samavid  Williams III M.D   On: 03/22/2017 20:33    Assessment/Plan: Day of Surgery   Active Problems:   Hip fx [facility](HCC)   Patient is stable postop. She will start physical and occupational therapy tomorrow as much as her dementia allows her to. Labs will be checked in the a.m. We'll continue to monitor her knee pain. CT scan a left knee showed no fracture or dislocation but advanced degenerative joint disease. Her postoperative x-rays of the right hip show the cannulated screws are well positioned. The fracture is well reduced.     Juanell FairlyKRASINSKI, Sajjad Honea , MD 03/23/2017, 7:24 PM

## 2017-03-23 NOTE — Anesthesia Preprocedure Evaluation (Signed)
Anesthesia Evaluation  Patient identified by MRN, date of birth, ID band Patient awake and Patient confused    Reviewed: Allergy & Precautions, NPO status , Patient's Chart, lab work & pertinent test results  Airway Mallampati: II  TM Distance: <3 FB     Dental  (+) Caps   Pulmonary neg pulmonary ROS,    Pulmonary exam normal        Cardiovascular hypertension, Normal cardiovascular exam     Neuro/Psych PSYCHIATRIC DISORDERS negative neurological ROS     GI/Hepatic negative GI ROS, Neg liver ROS,   Endo/Other  negative endocrine ROS  Renal/GU negative Renal ROS  negative genitourinary   Musculoskeletal  (+) Arthritis , Osteoarthritis,    Abdominal Normal abdominal exam  (+)   Peds negative pediatric ROS (+)  Hematology negative hematology ROS (+)   Anesthesia Other Findings dementia  Reproductive/Obstetrics                             Anesthesia Physical Anesthesia Plan  ASA: III  Anesthesia Plan:    Post-op Pain Management:    Induction: Intravenous  PONV Risk Score and Plan:   Airway Management Planned:   Additional Equipment:   Intra-op Plan:   Post-operative Plan:   Informed Consent:   Dental advisory given  Plan Discussed with: CRNA and Surgeon  Anesthesia Plan Comments:         Anesthesia Quick Evaluation

## 2017-03-23 NOTE — Consult Note (Signed)
ORTHOPAEDIC CONSULTATION  REQUESTING PHYSICIAN: Altamese Dilling, *  Chief Complaint: Right hip pain  HPI: Katie Fitzgerald is a 77 y.o. female who complains of right hip pain after a fall at home yesterday.  Is seen in her hospital room this morning with her daughter at the bedside. Patient denies any significant hip pain while at rest. She has had pain in the left knee since the fall. 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 A CT scan has confirmed advanced degenerative joint disease without evidence of fracture.  Past Medical History:  Diagnosis Date  . Dementia   . Hypertension   . Osteoarthrosis    Past Surgical History:  Procedure Laterality Date  . KNEE SURGERY    . Open reduction internal fixation of the left olecranon fracture Left   . POSTERIOR LUMBAR SPINE FUSION ONE LEVEL     Social History   Socioeconomic History  . Marital status: Divorced    Spouse name: None  . Number of children: None  . Years of education: None  . Highest education level: None  Social Needs  . Financial resource strain: None  . Food insecurity - worry: None  . Food insecurity - inability: None  . Transportation needs - medical: None  . Transportation needs - non-medical: None  Occupational History  . None  Tobacco Use  . Smoking status: Never Smoker  . Smokeless tobacco: Never Used  Substance and Sexual Activity  . Alcohol use: No  . Drug use: No  . Sexual activity: None  Other Topics Concern  . None  Social History Narrative  . None   Family History  Problem Relation Age of Onset  . Dementia Mother   . Stroke Father    Allergies  Allergen Reactions  . Amoxicillin     Other reaction(s): Unknown  . Penicillin G     Other reaction(s): Unknown   Prior to Admission medications   Medication Sig Start Date End Date Taking? Authorizing Provider  citalopram (CELEXA) 20 MG tablet Take 10-20 mg by mouth See admin instructions. Take 1/2 tablet (10mg ) by mouth  daily for 1 week, then increase to 1 tablet by mouth every day. 04/27/15   [provider]  memantine (NAMENDA) 10 MG tablet Take 10 mg by mouth 2 (two) times daily. 04/15/15   [provider]   Dg Pelvis 1-2 Views  Result Date: 03/22/2017 CLINICAL DATA:  Pain after trauma EXAM: PELVIS - 1-2 VIEW COMPARISON:  None. FINDINGS: There is a subcapital fracture in the right hip without dislocation identified. There is a healed left inferior pubic ramus fracture. Left hip is grossly intact. Surgical hardware seen in the lower lumbar spine. IMPRESSION: 1. Subcapital right hip fracture. Electronically Signed   By: Gerome Sam III M.D   On: 03/22/2017 20:35   Ct Knee Left Wo Contrast  Result Date: 03/22/2017 CLINICAL DATA:  Knee pain after fall this evening. Right hip fracture. EXAM: CT OF THE left KNEE WITHOUT CONTRAST TECHNIQUE: Multidetector CT imaging of the left knee was performed according to the standard protocol. Multiplanar CT image reconstructions were also generated. COMPARISON:  None. FINDINGS: No fracture line or displaced fracture fragment identified. Advanced tricompartmental degenerative changes with associated joint space narrowings and osteophyte formation, most severely involving the medial compartment where there is near abutment of the medial femoral condyle and medial tibial plateau. Anterior cruciate ligament is not adequately visualized to assess integrity. Posterior cruciate ligament is grossly intact. No evidence of medial or  lateral collateral ligament tear seen. Joint effusion.  Superficial soft tissues are unremarkable. IMPRESSION: 1. No acute osseous fracture or dislocation. 2. Joint effusion, small to moderate in size, predominantly localized to the suprapatellar bursa, of uncertain chronicity but most likely chronic given the underlying degenerative change. 3. Advanced degenerative osteoarthritis, most severely involving the medial compartment where there is near  abutment of the medial femoral condyle and medial tibial plateau. Electronically Signed   By: Bary RichardStan  Maynard M.D.   On: 03/22/2017 22:32   Dg Knee Complete 4 Views Left  Result Date: 03/22/2017 CLINICAL DATA:  Pain after fall EXAM: LEFT KNEE - COMPLETE 4+ VIEW COMPARISON:  None. FINDINGS: No fracture or dislocation. No joint effusion. Tricompartmental degenerative changes, most marked in the medial compartment. IMPRESSION: Degenerative changes.  No fracture or dislocation. Electronically Signed   By: Gerome Samavid  Williams III M.D   On: 03/22/2017 20:34   Dg Femur Min 2 Views Right  Result Date: 03/22/2017 CLINICAL DATA:  Bilateral knee pain after fall.  Right femoral pain. EXAM: RIGHT FEMUR 2 VIEWS COMPARISON:  Pelvis film April 29, 2015 FINDINGS: There is a subcapital fracture of the right hip. No other femoral fractures identified. Severe degenerative changes in the right knee. IMPRESSION: Subcapital fracture of the right hip. Electronically Signed   By: Gerome Samavid  Williams III M.D   On: 03/22/2017 20:33    Positive ROS: All other systems have been reviewed and were otherwise negative with the exception of those mentioned in the HPI and as above.  Physical Exam: General: Alert, no acute distress  MUSCULOSKELETAL: Right hip: Patient's skin is intact. There is no erythema or ecchymosis or significant swelling. Her compartments are soft and compressible and the right lower extremity. Patient can flex and extend her toes and dorsiflex and plantarflex her ankle in the lower extremity. She has palpable pedal pulses and intact sensation light touch in both lower extremities as well.  Assessment: Nondisplaced, impacted right femoral neck hip fracture  Plan: Iceman to the patient and her daughter the nature of the fracture. I'm recommending percutaneous fixation for this impacted, nondisplaced femoral neck hip fracture. We discussed the details of the operation as well as the postoperative course. She will need  to be protected weightbearing for 6 weeks postop. We discussed the risks and benefits of surgery as well. They understand the risks include but are not limited to infection, bleeding, nerve or blood vessel injury, malunion, nonunion, hardware failure, avascular necrosis, leg length discrepancy, change in lower extremity rotation and the need for further surgery including hemi-or total hip arthroplasty. They also understand the medical risks include but are not limited to DVT and pulmonary embolism, myocardial infarction, stroke, pneumonia, respiratory failure and death. They understood these risks and wished to proceed with surgery.  Patient has been cleared by the hospitalist service for surgery. I reviewed the patient's radiographs studies and labs in preparation for this procedure. She is nothing by mouth and surgery is scheduled for later today.    Juanell FairlyKRASINSKI, Evani Shrider, MD    03/23/2017 9:01 AM

## 2017-03-23 NOTE — Op Note (Signed)
03/23/2017  2:49 PM  PATIENT:  Katie Fitzgerald    PRE-OPERATIVE DIAGNOSIS:  Right  Impacted femoral neck hip fracture  POST-OPERATIVE DIAGNOSIS:  Same  PROCEDURE:  CANNULATED HIP PINNING, RIGHT HIP  SURGEON:  Juanell FairlyKRASINSKI, Kellsey Sansone, MD  ANESTHESIA:   Spinal  PREOPERATIVE INDICATIONS:  Katie MainsDianne V Fitzgerald is a  77 y.o. female who fell and was found to have a diagnosis of impacted right femoral neck hip fracture who elected for surgical management.    The risks benefits and alternatives were discussed with the patient preoperatively including but not limited to infection, bleeding, nerve or blood vessel injury, persistent hip pain,  malunion, nonunion, avascular necrosis, change in lower extremity rotation, leg length discrepancy, failure of the hardware and the need for revision surgery, including the potential for conversion to hemi or total hip arthroplasty. Medical risks include but are not limited to: DVT and pulmonary embolism, myocardial infarction, stroke, pneumonia, respiratory failure and death.  The patient understood and agreed with the plan for surgery.  Patient had the right hip marked with the word yes and my initials according the hospitals correct site of surgery protocol.  OPERATIVE IMPLANTS: Zimmer 6.5 mm cannulated screws x 3  OPERATIVE FINDINGS: Osteoporotic bone of right hip  OPERATIVE PROCEDURE: The patient was brought to the operating room and a general anesthesia with an LMA was administered by the anesthesia service.  The patient was then placed supine on the fracture table. IV clindamycine was administered due to the patient's allergy to peniciilin. The right lower extremity was positioned in a leg holder, without any significant reduction maneuver other than mild internal rotation.  The well leg was placed in hemi-lithotomy position. The hip was prepped and draped in usual sterile fashion.  A time out was performed to verify the patient's name, date of birth, medical record  number, correct site of surgery and correct surger to be performed. The  timeout was also used to verify the patient had received antibiotics that all appropriate instruments, implants and radiographic studies were available in the room. Once all in attendance were in agreement case began..  Once the reduction was deemed near-anatomic, a small lateral incision was made in line with the femur, distal to the greater trochanter, and 3 guidewires were introduced Into the lateral cortex of the femur, across the fracture site and into the humeral head in an inverted triangle configuration. The lengths of these guidepins were measured with a depth gauge. The lateral cortex was then opened with a cannulated drill, and then the cannulated screws were advanced into position and tightened by hand. Solid fixation was achieved.  The guide pins were then removed and final C-arm images were taken of the fracture fixation. The fracture was well reduced and the hardware in good position.  The wound was irrigated copiously, and the deep and subcutaneous tissues were repaired with 0 and 2-0 Vicryl suture respectively and the skin was approximated with staples.  I was scrubbed and present the entire case and all sharp and instrument counts were correct at the conclusion of the case.  I spoke with the patient's daughter in the postop consultation room to let her know the case was completed without complication and the patient was stable in the recovery room.    Kathreen DevoidKevin L. Hawkin Charo, MD

## 2017-03-23 NOTE — Anesthesia Postprocedure Evaluation (Signed)
Anesthesia Post Note  Patient: Katie MainsDianne V Bandel  Procedure(s) Performed: CANNULATED HIP PINNING (Right )  Patient location during evaluation: PACU Anesthesia Type: General Level of consciousness: awake and alert and oriented Pain management: pain level controlled Vital Signs Assessment: post-procedure vital signs reviewed and stable Respiratory status: spontaneous breathing Cardiovascular status: blood pressure returned to baseline Anesthetic complications: no     Last Vitals:  Vitals:   03/23/17 1727 03/23/17 1844  BP: (!) 122/57 (!) 109/57  Pulse: 73 89  Resp: 18 18  Temp: 37 C 37 C  SpO2: 95% 97%    Last Pain:  Vitals:   03/23/17 1844  TempSrc: Oral  PainSc:                  Kayton Dunaj

## 2017-03-24 ENCOUNTER — Encounter: Payer: Self-pay | Admitting: Orthopedic Surgery

## 2017-03-24 LAB — BASIC METABOLIC PANEL
Anion gap: 6 (ref 5–15)
BUN: 14 mg/dL (ref 6–20)
CHLORIDE: 110 mmol/L (ref 101–111)
CO2: 25 mmol/L (ref 22–32)
CREATININE: 0.81 mg/dL (ref 0.44–1.00)
Calcium: 8.4 mg/dL — ABNORMAL LOW (ref 8.9–10.3)
GFR calc Af Amer: >60 mL/min (ref >60)
GFR calc non Af Amer: >60 mL/min (ref >60)
GLUCOSE: 96 mg/dL (ref 65–99)
Potassium: 3.5 mmol/L (ref 3.5–5.1)
SODIUM: 141 mmol/L (ref 135–145)

## 2017-03-24 LAB — CBC
HCT: 32.9 % — ABNORMAL LOW (ref 35.0–47.0)
HEMOGLOBIN: 11.2 g/dL — AB (ref 12.0–16.0)
MCH: 30.6 pg (ref 26.0–34.0)
MCHC: 34 g/dL (ref 32.0–36.0)
MCV: 89.9 fL (ref 80.0–100.0)
PLATELETS: 215 10*3/uL (ref 150–440)
RBC: 3.66 MIL/uL — ABNORMAL LOW (ref 3.80–5.20)
RDW: 13.9 % (ref 11.5–14.5)
WBC: 7.8 10*3/uL (ref 3.6–11.0)

## 2017-03-24 MED ORDER — GABAPENTIN 600 MG PO TABS
300.0000 mg | ORAL_TABLET | Freq: Two times a day (BID) | ORAL | Status: DC
  Administered 2017-03-24 – 2017-03-25 (×3): 300 mg via ORAL
  Filled 2017-03-24 (×3): qty 0.5

## 2017-03-24 MED ORDER — QUETIAPINE FUMARATE 25 MG PO TABS
25.0000 mg | ORAL_TABLET | Freq: Once | ORAL | Status: AC
  Administered 2017-03-24: 25 mg via ORAL
  Filled 2017-03-24: qty 1

## 2017-03-24 NOTE — Clinical Social Work Placement (Signed)
   CLINICAL SOCIAL WORK PLACEMENT  NOTE  Date:  03/24/2017  Patient Details  Name: Katie Fitzgerald MRN: [id number] Date of Birth: 01/24/1941  Clinical Social Work is seeking post-discharge placement for this patient at the Skilled  Nursing Facility level of care (*CSW will initial, date and re-position this form in  chart as items are completed):  Yes   Patient/family provided with [facility] Clinical Social Work Department's list of facilities offering this level of care within the geographic area requested by the patient (or if unable, by the patient's family).  Yes   Patient/family informed of their freedom to choose among providers that offer the needed level of care, that participate in Medicare, Medicaid or managed care program needed by the patient, have an available bed and are willing to accept the patient.  Yes   Patient/family informed of [facility]'s ownership interest in [facility] and [facility], as well as of the fact that they are under no obligation to receive care at these facilities.  PASRR submitted to EDS on       PASRR number received on       Existing PASRR number confirmed on 03/24/17     FL2 transmitted to all facilities in geographic area requested by pt/family on 03/24/17     FL2 transmitted to all facilities within larger geographic area on       Patient informed that his/her managed care company has contracts with or will negotiate with certain facilities, including the following:        Yes   Patient/family informed of bed offers received.  Patient chooses bed at [facility] )     Physician recommends and patient chooses bed at      Patient to be transferred to   on  .  Patient to be transferred to facility by       Patient family notified on   of transfer.  Name of family member notified:        PHYSICIAN       Additional Comment:    _______________________________________________ Avrie Kedzierski, Darleen CrockerBailey M, LCSW 03/24/2017, 5:21  PM

## 2017-03-24 NOTE — Evaluation (Signed)
Physical Therapy Evaluation Patient Details Name: Katie Fitzgerald MRN: [id number] DOB: 31-Oct-1940 Today's Date: 03/24/2017   History of Present Illness  Pt is a76 y.o.femalewith a known history of dementia, HTN, and OA who presented to the emergency room status post mechanical fall/tripping over dog.  ER workup noted for right hip fracture, patient also complained of left knee pain with CT of the knee negative for fracture but positive for advanced degenerative joint disease.  The patient was admitted for acute right hip fracture and acute left knee pain and is s/p R hip cannulated pinning.    Clinical Impression  Pt presents with deficits in strength, transfers, mobility, gait, balance, and activity tolerance.  Pt required extensive assist with bed mobility tasks along with constant verbal and tactile cues for sequencing.  Pt's R foot placed on top of this PT's foot to ensure WB status compliance during sit to stand transfer.  Pt able to stand but even with extensive verbal and tactile cues pt was unable to maintain TTWB status to RLE and was returned to sitting at the EOB.  Pt will benefit from PT services in a SNF setting upon discharge to safely address above deficits for decreased caregiver assistance and eventual return to PLOF.      Follow Up Recommendations SNF    Equipment Recommendations  Other (comment)(TBD at next venue of care)    Recommendations for Other Services       Precautions / Restrictions Precautions Precautions: Fall Restrictions Weight Bearing Restrictions: Yes RLE Weight Bearing: Touchdown weight bearing      Mobility  Bed Mobility Overal bed mobility: Needs Assistance Bed Mobility: Sit to Supine;Supine to Sit     Supine to sit: Mod assist Sit to supine: Mod assist   General bed mobility comments: Mod A for BLEs in and out of bed  Transfers Overall transfer level: Needs assistance Equipment used: Rolling walker (2 wheeled) Transfers: Sit  to/from Stand Sit to Stand: Mod assist         General transfer comment: Extensive verbal and tactile cues for sequencing to maintain WB status with pt unable to keep from bearing weight through RLE and was returned to sitting.   Ambulation/Gait             General Gait Details: Unable/unsafe to attempt secondary to pt's cognition and inability to maintain WB status  Stairs            Wheelchair Mobility    Modified Rankin (Stroke Patients Only)       Balance Overall balance assessment: Needs assistance Sitting-balance support: Feet supported;No upper extremity supported Sitting balance-Leahy Scale: Good         Standing balance comment: Unable to assess secondary to pt unable to stand while maintaining WB status                             Pertinent Vitals/Pain Pain Assessment: No/denies pain    Home Living Family/patient expects to be discharged to:: Private residence(Daughter Thayer Ohm at bedside to assist with history) Living Arrangements: Alone Available Help at Discharge: Family;Personal care attendant;Available PRN/intermittently(Pt lives in cottage on daughter's property) Type of Home: House Home Access: Level entry     Home Layout: One level Home Equipment: Other (comment)(Daughter unsure)      Prior Function Level of Independence: Needs assistance   Gait / Transfers Assistance Needed: Pt Ind with Amb without AD with no other recent falls other  than current fall  ADL's / Homemaking Assistance Needed: Family and PCA assist with medications, driving, and meals  Comments: Pt Ind with amb      Hand Dominance   Dominant Hand: Right    Extremity/Trunk Assessment   Upper Extremity Assessment Upper Extremity Assessment: Overall WFL for tasks assessed    Lower Extremity Assessment Lower Extremity Assessment: Generalized weakness;RLE deficits/detail RLE Deficits / Details: R hip flex <3/5, R knee flex and ext 3+/5 RLE: Unable to  fully assess due to pain       Communication   Communication: No difficulties  Cognition Arousal/Alertness: Awake/alert Behavior During Therapy: WFL for tasks assessed/performed Overall Cognitive Status: History of cognitive impairments - at baseline                                        General Comments      Exercises Total Joint Exercises Ankle Circles/Pumps: AROM;Both;10 reps(Extensive verbal and tactile cues for technique with all exercises) Quad Sets: AAROM;Both;5 reps Short Arc Quad: AAROM;Both;5 reps Hip ABduction/ADduction: AAROM;Both;5 reps Straight Leg Raises: AAROM;Both;5 reps Long Arc Quad: AROM;Both;10 reps Knee Flexion: AROM;Both;5 reps;10 reps   Assessment/Plan    PT Assessment Patient needs continued PT services  PT Problem List Decreased strength;Decreased activity tolerance;Decreased balance;Decreased mobility;Decreased knowledge of use of DME;Decreased safety awareness;Decreased knowledge of precautions       PT Treatment Interventions DME instruction;Gait training;Functional mobility training;Balance training;Therapeutic exercise;Therapeutic activities;Patient/family education    PT Goals (Current goals can be found in the Care Plan section)  Acute Rehab PT Goals PT Goal Formulation: Patient unable to participate in goal setting Time For Goal Achievement: 04/06/17 Potential to Achieve Goals: Good    Frequency BID   Barriers to discharge Decreased caregiver support;Inaccessible home environment      Co-evaluation               AM-PAC PT "6 Clicks" Daily Activity  Outcome Measure Difficulty turning over in bed (including adjusting bedclothes, sheets and blankets)?: Unable Difficulty moving from lying on back to sitting on the side of the bed? : Unable Difficulty sitting down on and standing up from a chair with arms (e.g., wheelchair, bedside commode, etc,.)?: Unable Help needed moving to and from a bed to chair (including  a wheelchair)?: Total Help needed walking in hospital room?: Total Help needed climbing 3-5 steps with a railing? : Total 6 Click Score: 6    End of Session Equipment Utilized During Treatment: Gait belt Activity Tolerance: Patient tolerated treatment well Patient left: in bed;with call bell/phone within reach;with family/visitor present;with bed alarm set Nurse Communication: Mobility status;Weight bearing status PT Visit Diagnosis: Other abnormalities of gait and mobility (R26.89);Muscle weakness (generalized) (M62.81)    Time: [id number] PT Time Calculation (min) (ACUTE ONLY): 32 min   Charges:   PT Evaluation $PT Eval Low Complexity: 1 Low PT Treatments $Therapeutic Exercise: 8-22 mins   PT G Codes:        Elly Modena. Scott Tarri Guilfoil PT, DPT 03/24/17, 10:37 AM

## 2017-03-24 NOTE — Progress Notes (Signed)
Pt yelling, crying, and trying to get out of bed. Patient redirected with no success. MD notified. New order obtained.

## 2017-03-24 NOTE — Evaluation (Signed)
Occupational Therapy Evaluation Patient Details Name: Katie MainsDianne V Cuffie MRN: [id number] DOB: 12/13/1940 Today's Date: 03/24/2017    History of Present Illness Pt is a76 y.o.femalewith a known history of dementia, HTN, and OA who presented to the emergency room status post mechanical fall/tripping over dog.  ER workup noted for right hip fracture, patient also complained of left knee pain with CT of the knee negative for fracture but positive for advanced degenerative joint disease.  The patient was admitted for acute right hip fracture and acute left knee pain and is s/p R hip cannulated pinning.   Clinical Impression    Pt. Is a 77 y.o. female who was [facility] after tripping and falling over her dog and underwent a R hip cannulated pinning. Pt presents with hx of dementia and has pain, limited ROM, weakness, limited activity tolerance, and impaired functional mobility for ADLs. She is TTWB on RLE with difficulty remembering and needs constant reminders.  Pt. could benefit from skilled OT services for ADL retraining with caregiver ed and training, and functional mobility for ADLs in order to improve overall ADL functioning, and return to PLOF.  Rec SNF for continued rehab before returning home with 24 hour supervision and assist as needed.     Follow Up Recommendations  SNF    Equipment Recommendations       Recommendations for Other Services       Precautions / Restrictions Precautions Precautions: Fall Restrictions Weight Bearing Restrictions: Yes RLE Weight Bearing: Touchdown weight bearing Other Position/Activity Restrictions: Per MD ok for PWB for transfers only, otherwise TWB      Mobility Bed Mobility Overal bed mobility: Needs Assistance Bed Mobility: Sit to Supine;Supine to Sit     Supine to sit: Mod assist Sit to supine: Mod assist   General bed mobility comments: Mod A for BLEs in and out of bed  Transfers Overall transfer level: Needs  assistance Equipment used: Rolling walker (2 wheeled) Transfers: Sit to/from [employer]GI CorporationStand;Stand Pivot Transfers Sit to Stand: Min assist Stand pivot transfers: Min assist       General transfer comment: Extensive verbal and tactile cues for sequencing for PWB status during transfer from bed to chair    Balance Overall balance assessment: Needs assistance Sitting-balance support: Feet supported;No upper extremity supported Sitting balance-Leahy Scale: Good     Standing balance support: Bilateral upper extremity supported Standing balance-Leahy Scale: Fair                             ADL either performed or assessed with clinical judgement   ADL Overall ADL's : Needs assistance/impaired Eating/Feeding: Set up;Minimal assistance;Supervision/ safety Eating/Feeding Details (indicate cue type and reason): cues to locate food and appears to have some prior visual deficits more for far range vs close up. Grooming: Wash/dry hands;Wash/dry face;Oral care;Set up;Supervision/safety;Modified independent;Cueing for safety;Cueing for sequencing           Upper Body Dressing : Supervision/safety;Set up;Min guard   Lower Body Dressing: Sit to/from stand;Set up;Moderate assistance Lower Body Dressing Details (indicate cue type and reason): mainly to remind pt to follow TTWB on RLE due to poor memory and hx of dementia---most likely will not be able to use any AD for dressing and discussed with family and her sitter who stays with her at home, [place]                      Vision Baseline Vision/History:  Wears glasses Wears Glasses: At all times Patient Visual Report: No change from baseline Additional Comments: pt appears to have difficulty seeing people in room sitting at EOB and compensates with head turning to left.     Perception     Praxis      Pertinent Vitals/Pain Pain Assessment: No/denies pain(pt did not recall that she had hip surgery and had to be shown op site and  bandage)     Hand Dominance Right   Extremity/Trunk Assessment Upper Extremity Assessment Upper Extremity Assessment: Overall WFL for tasks assessed;Generalized weakness(weakness in LUE with decreased AROM in shoulder and elbow for last 10 degrees and weaker than R but able to use for ADLs)   Lower Extremity Assessment Lower Extremity Assessment: Defer to PT evaluation       Communication Communication Communication: No difficulties   Cognition Arousal/Alertness: Awake/alert Behavior During Therapy: Restless Overall Cognitive Status: History of cognitive impairments - at baseline                                     General Comments       Exercises Total Joint Exercises Ankle Circles/Pumps: AROM;Both;Strengthening;5 reps;10 reps Short Arc Quad: AROM;Both;5 reps;10 reps;AAROM Hip ABduction/ADduction: AROM;AAROM;Both;5 reps;10 reps Straight Leg Raises: AROM;AAROM;Both;5 reps;10 reps Long Arc Quad: AROM;Both;5 reps;10 reps Knee Flexion: AROM;Both;5 reps;10 reps Marching in Standing: AAROM;AROM;Both;10 reps;Seated(Seated)   Shoulder Instructions      Home Living Family/patient expects to be discharged to:: Private residence Living Arrangements: Alone Available Help at Discharge: Family;Personal care attendant;Available PRN/intermittently Type of Home: House Home Access: Level entry     Home Layout: One level         Bathroom Toilet: Handicapped height                Prior Functioning/Environment Level of Independence: Needs assistance  Gait / Transfers Assistance Needed: Pt Ind with Amb without AD with no other recent falls other than current fall ADL's / Homemaking Assistance Needed: Family and PCA assist with medications, driving, and meals and cueing and supervision for safety and completion of ADLs.            OT Problem List: Decreased strength;Decreased range of motion;Decreased activity tolerance;Decreased safety awareness;Impaired  balance (sitting and/or standing);Decreased cognition;Pain      OT Treatment/Interventions: Self-care/ADL training;Patient/family education    OT Goals(Current goals can be found in the care plan section) Acute Rehab OT Goals Patient Stated Goal: pt not able to state goals but family is able to  OT Goal Formulation: With patient/family Time For Goal Achievement: 04/07/17 Potential to Achieve Goals: Fair ADL Goals Pt Will Perform Lower Body Dressing: with min assist;sit to/from stand;with set-up Pt Will Transfer to Toilet: with supervision;with set-up;stand pivot transfer;bedside commode Pt Will Perform Toileting - Clothing Manipulation and hygiene: with set-up;with min guard assist;sit to/from stand  OT Frequency: Min 1X/week   Barriers to D/C:            Co-evaluation              AM-PAC PT "6 Clicks" Daily Activity     Outcome Measure Help from another person eating meals?: A Little Help from another person taking care of personal grooming?: A Little Help from another person toileting, which includes using toliet, bedpan, or urinal?: A Lot Help from another person bathing (including washing, rinsing, drying)?: A Lot Help from another person to put on and  taking off regular upper body clothing?: A Little Help from another person to put on and taking off regular lower body clothing?: A Lot 6 Click Score: 15   End of Session    Activity Tolerance: Patient tolerated treatment well Patient left: in chair;with call bell/phone within reach;with chair alarm set;with nursing/sitter in room;with family/visitor present  OT Visit Diagnosis: Unsteadiness on feet (R26.81);Muscle weakness (generalized) (M62.81);History of falling (Z91.81);Pain Pain - Right/Left: Right Pain - part of body: Hip                Time: 1415-1440 OT Time Calculation (min): 25 min Charges:  OT General Charges $OT Visit: 1 Visit OT Evaluation $OT Eval Low Complexity: 1 Low OT Treatments $Self  Care/Home Management : 8-22 mins G-Codes:     Susanne Borders, OTR/L ascom [phone] 03/24/17, 3:02 PM

## 2017-03-24 NOTE — [place] FL2 (Signed)
[place] MEDICAID FL2 LEVEL OF CARE SCREENING TOOL     IDENTIFICATION  Patient Name: Katie MainsDianne V Fitzgerald Birthdate: 11/29/1940 Sex: female Admission Date (Current Location): 03/22/2017  [place]   Facility and Address:  [facility], [address], [place], [place] [address]      Provider Number: [id number]  Attending Physician Name and Address:  Altamese DillingVachhani, Cavion Faiola, *  Relative Name and Phone Number:       Current Level of Care: Hospital Recommended Level of Care: Skilled Nursing Facility Prior Approval Number:    Date Approved/Denied:   PASRR Number: ([id number] [id number])  Discharge Plan: SNF    Current Diagnoses: Patient Active Problem List   Diagnosis Date Noted  . Hip fx (HCC) 03/22/2017  . Syncope 04/29/2015  . Fracture of left inferior pubic ramus (HCC) 04/29/2015    Orientation RESPIRATION BLADDER Height & Weight     Self  Normal Continent Weight: 114 lb (51.7 kg) Height:  5\' 4"  (162.6 cm)  BEHAVIORAL SYMPTOMS/MOOD NEUROLOGICAL BOWEL NUTRITION STATUS      Continent Diet(Regular Diet. )  AMBULATORY STATUS COMMUNICATION OF NEEDS Skin   Extensive Assist Verbally Surgical wounds(Incision: Right Hip. )                       Personal Care Assistance Level of Assistance  Bathing, Feeding, Dressing Bathing Assistance: Limited assistance Feeding assistance: Independent Dressing Assistance: Limited assistance     Functional Limitations Info  Sight, Hearing, Speech Sight Info: Adequate Hearing Info: Adequate Speech Info: Adequate    SPECIAL CARE FACTORS FREQUENCY  PT (By licensed PT), OT (By licensed OT)     PT Frequency: (5) OT Frequency: (5)            Contractures      Additional Factors Info  Code Status, Allergies Code Status Info: (Full Code. ) Allergies Info: (Amoxicillin, Penicillin G)           Current Medications (03/24/2017):  This is the current hospital active medication  list Current Facility-Administered Medications  Medication Dose Route Frequency Provider Last Rate Last Dose  . acetaminophen (TYLENOL) tablet 650 mg  650 mg Oral Q6H PRN Salary, Jetty DuhamelMontell D, MD   650 mg at 03/24/17 [id number]   Or  . acetaminophen (TYLENOL) suppository 650 mg  650 mg Rectal Q6H PRN Salary, Montell D, MD      . alum & mag hydroxide-simeth (MAALOX/MYLANTA) 200-200-20 MG/5ML suspension 30 mL  30 mL Oral Q4H PRN Juanell FairlyKrasinski, Kevin, MD      . bisacodyl (DULCOLAX) suppository 10 mg  10 mg Rectal Daily PRN Salary, Montell D, MD      . docusate sodium (COLACE) capsule 100 mg  100 mg Oral BID Angelina OkSalary, Montell D, MD   100 mg at 03/24/17 0936  . enoxaparin (LOVENOX) injection 40 mg  40 mg Subcutaneous Q24H Juanell FairlyKrasinski, Kevin, MD   40 mg at 03/24/17 0936  . gabapentin (NEURONTIN) tablet 300 mg  300 mg Oral BID Altamese DillingVachhani, Kailoni Vahle, MD      . hydrALAZINE (APRESOLINE) injection 10 mg  10 mg Intravenous Q4H PRN Salary, Montell D, MD      . HYDROcodone-acetaminophen (NORCO/VICODIN) 5-325 MG per tablet 1-2 tablet  1-2 tablet Oral Q4H PRN Bertrum SolSalary, Montell D, MD   2 tablet at 03/23/17 2017  . Influenza vac split quadrivalent PF (FLUZONE HIGH-DOSE) injection 0.5 mL  0.5 mL Intramuscular Tomorrow-1000 Salary, Evelena AsaMontell D, MD      .  magnesium citrate solution 1 Bottle  1 Bottle Oral Once PRN Juanell Fairly, MD      . menthol-cetylpyridinium (CEPACOL) lozenge 3 mg  1 lozenge Oral PRN Juanell Fairly, MD       Or  . phenol (CHLORASEPTIC) mouth spray 1 spray  1 spray Mouth/Throat PRN Juanell Fairly, MD      . morphine 2 MG/ML injection 2 mg  2 mg Intravenous Q2H PRN Juanell Fairly, MD      . ondansetron [facility]) tablet 4 mg  4 mg Oral Q6H PRN Salary, Montell D, MD       Or  . ondansetron (ZOFRAN) injection 4 mg  4 mg Intravenous Q6H PRN Salary, Montell D, MD   4 mg at 03/23/17 1612  . polyethylene glycol (MIRALAX / GLYCOLAX) packet 17 g  17 g Oral Daily PRN Angelina Ok D, MD   17 g at 03/24/17 0936  .  senna (SENOKOT) tablet 8.6 mg  1 tablet Oral BID Juanell Fairly, MD   8.6 mg at 03/24/17 4098     Discharge Medications: Please see discharge summary for a list of discharge medications.  Relevant Imaging Results:  Relevant Lab Results:   Additional Information (SSN: [id number])  Sample, Darleen Crocker, LCSW

## 2017-03-24 NOTE — Progress Notes (Signed)
[facility] [place] [facility]   PATIENT NAME: Katie Fitzgerald    MR#:  [id number]  DATE OF BIRTH:  09/22/1940  SUBJECTIVE:  CHIEF COMPLAINT:   Chief Complaint  Patient presents with  . Fall    Came after accidental fall, hip fracture. Status post surgery, no complaints.  REVIEW OF SYSTEMS:  CONSTITUTIONAL: No fever, fatigue or weakness.  EYES: No blurred or double vision.  EARS, NOSE, AND THROAT: No tinnitus or ear pain.  RESPIRATORY: No cough, shortness of breath, wheezing or hemoptysis.  CARDIOVASCULAR: No chest pain, orthopnea, edema.  GASTROINTESTINAL: No nausea, vomiting, diarrhea or abdominal pain.  GENITOURINARY: No dysuria, hematuria.  ENDOCRINE: No polyuria, nocturia,  HEMATOLOGY: No anemia, easy bruising or bleeding SKIN: No rash or lesion. MUSCULOSKELETAL: Right hip pain.   NEUROLOGIC: No tingling, numbness, weakness.  PSYCHIATRY: No anxiety or depression.   ROS  DRUG ALLERGIES:   Allergies  Allergen Reactions  . Amoxicillin     Other reaction(s): Unknown  . Penicillin G     Other reaction(s): Unknown    VITALS:  Blood pressure (!) 136/48, pulse 78, temperature 99.3 F (37.4 C), temperature source Oral, resp. rate 20, height 5\' 4"  (1.626 m), weight 51.7 kg (114 lb), SpO2 96 %.  PHYSICAL EXAMINATION:  GENERAL:  77 y.o.-year-old patient lying in the bed with no acute distress.  EYES: Pupils equal, round, reactive to light and accommodation. No scleral icterus. Extraocular muscles intact.  HEENT: Head atraumatic, normocephalic. Oropharynx and nasopharynx clear.  NECK:  Supple, no jugular venous distention. No thyroid enlargement, no tenderness.  LUNGS: Normal breath sounds bilaterally, no wheezing, rales,rhonchi or crepitation. No use of accessory muscles of respiration.  CARDIOVASCULAR: S1, S2 normal. No murmurs, rubs, or gallops.  ABDOMEN: Soft, nontender, nondistended. Bowel sounds present. No organomegaly or mass.  EXTREMITIES: No  pedal edema, cyanosis, or clubbing.  NEUROLOGIC: Cranial nerves II through XII are intact. Muscle strength 5/5 in all extremities except right hip which is painful , s/p surgery and she is not moving lower extremity much. Sensation intact. Gait not checked.  PSYCHIATRIC: The patient is alert and oriented x 1. Have baseline dementia. SKIN: No obvious rash, lesion, or ulcer.   Physical Exam LABORATORY PANEL:   CBC Recent Labs  Lab 03/24/17 0421  WBC 7.8  HGB 11.2*  HCT 32.9*  PLT 215   ------------------------------------------------------------------------------------------------------------------  Chemistries  Recent Labs  Lab 03/24/17 0421  NA 141  K 3.5  CL 110  CO2 25  GLUCOSE 96  BUN 14  CREATININE 0.81  CALCIUM 8.4*   ------------------------------------------------------------------------------------------------------------------  Cardiac Enzymes No results for input(s): TROPONINI in the last 168 hours. ------------------------------------------------------------------------------------------------------------------  RADIOLOGY:  Dg Pelvis 1-2 Views  Result Date: 03/22/2017 CLINICAL DATA:  Pain after trauma EXAM: PELVIS - 1-2 VIEW COMPARISON:  None. FINDINGS: There is a subcapital fracture in the right hip without dislocation identified. There is a healed left inferior pubic ramus fracture. Left hip is grossly intact. Surgical hardware seen in the lower lumbar spine. IMPRESSION: 1. Subcapital right hip fracture. Electronically Signed   By: Gerome Samavid  Williams III M.D   On: 03/22/2017 20:35   Ct Knee Left Wo Contrast  Result Date: 03/22/2017 CLINICAL DATA:  Knee pain after fall this evening. Right hip fracture. EXAM: CT OF THE left KNEE WITHOUT CONTRAST TECHNIQUE: Multidetector CT imaging of the left knee was performed according to the standard protocol. Multiplanar CT image reconstructions were also generated. COMPARISON:  None. FINDINGS: No fracture line  or displaced  fracture fragment identified. Advanced tricompartmental degenerative changes with associated joint space narrowings and osteophyte formation, most severely involving the medial compartment where there is near abutment of the medial femoral condyle and medial tibial plateau. Anterior cruciate ligament is not adequately visualized to assess integrity. Posterior cruciate ligament is grossly intact. No evidence of medial or lateral collateral ligament tear seen. Joint effusion.  Superficial soft tissues are unremarkable. IMPRESSION: 1. No acute osseous fracture or dislocation. 2. Joint effusion, small to moderate in size, predominantly localized to the suprapatellar bursa, of uncertain chronicity but most likely chronic given the underlying degenerative change. 3. Advanced degenerative osteoarthritis, most severely involving the medial compartment where there is near abutment of the medial femoral condyle and medial tibial plateau. Electronically Signed   By: Bary Richard M.D.   On: 03/22/2017 22:32   Dg Knee Complete 4 Views Left  Result Date: 03/22/2017 CLINICAL DATA:  Pain after fall EXAM: LEFT KNEE - COMPLETE 4+ VIEW COMPARISON:  None. FINDINGS: No fracture or dislocation. No joint effusion. Tricompartmental degenerative changes, most marked in the medial compartment. IMPRESSION: Degenerative changes.  No fracture or dislocation. Electronically Signed   By: Gerome Sam III M.D   On: 03/22/2017 20:34   Dg Hip Port Unilat With Pelvis 1v Right  Result Date: 03/23/2017 CLINICAL DATA:  Right hip repair. EXAM: DG HIP (WITH OR WITHOUT PELVIS) 1V PORT RIGHT COMPARISON:  None. FINDINGS: Three screws been placed across the right sub capital fracture. IMPRESSION: Three screws been placed across the right subcapital fracture. Electronically Signed   By: Gerome Sam III M.D   On: 03/23/2017 15:42   Dg Hip Operative Unilat W Or W/o Pelvis Right  Result Date: 03/23/2017 CLINICAL DATA:  Right hip pending  FLUOROSCOPY TIME:  39 seconds EXAM: OPERATIVE RIGHT HIP (WITH PELVIS IF PERFORMED) to VIEWS TECHNIQUE: Fluoroscopic spot image(s) were submitted for interpretation post-operatively. COMPARISON:  March 22, 2017 FINDINGS: Three screws are placed across the subcapital right hip fracture. IMPRESSION: Three screws have been placed across the subcapital right hip fracture. Electronically Signed   By: Gerome Sam III M.D   On: 03/23/2017 14:47   Dg Femur Min 2 Views Right  Result Date: 03/22/2017 CLINICAL DATA:  Bilateral knee pain after fall.  Right femoral pain. EXAM: RIGHT FEMUR 2 VIEWS COMPARISON:  Pelvis film April 29, 2015 FINDINGS: There is a subcapital fracture of the right hip. No other femoral fractures identified. Severe degenerative changes in the right knee. IMPRESSION: Subcapital fracture of the right hip. Electronically Signed   By: Gerome Sam III M.D   On: 03/22/2017 20:33    ASSESSMENT AND PLAN:   Active Problems:   Hip fx (HCC)  1 acute right hip fracture status post mechanical fall  Status post surgery, pain management and DVT prophylaxis as per orthopedic team. PT evaluation, likely need rehabilitation.  2 chronic dementia Stable Continue home regiment  3 chronic benign essential hypertension Exacerbated by above PRN hydralazine IV for systolic blood pressure greater than 160  4 chronic osteoarthritis Stable    All the records are reviewed and case discussed with Care Management/Social Workerr. Management plans discussed with the patient, family and they are in agreement.  CODE STATUS: full.  TOTAL TIME TAKING CARE OF THIS PATIENT: 35 minutes.   Discussed with her daughter in the room.  POSSIBLE D/C IN 1-2 DAYS, DEPENDING ON CLINICAL CONDITION.   Altamese Dilling M.D on 03/24/2017   Between 7am to 6pm - Pager -  [phone]  After 6pm go to [email or web address] - password [email or web address]  [facility]  Office   [phone]  CC: Primary care physician; Patient, No Pcp Per  Note: This dictation was prepared with Dragon dictation along with smaller phrase technology. Any transcriptional errors that result from this process are unintentional.

## 2017-03-24 NOTE — Progress Notes (Signed)
Physical Therapy Treatment Patient Details Name: Katie Fitzgerald MRN: [id number] DOB: 06/22/1940 Today's Date: 03/24/2017    History of Present Illness Pt is a76 y.o.femalewith a known history of dementia, HTN, and OA who presented to the emergency room status post mechanical fall/tripping over dog.  ER workup noted for right hip fracture, patient also complained of left knee pain with CT of the knee negative for fracture but positive for advanced degenerative joint disease.  The patient was admitted for acute right hip fracture and acute left knee pain and is s/p R hip cannulated pinning.    PT Comments    Pt presents with deficits in strength, transfers, mobility, gait, balance, and activity tolerance.  Pt continues to require extensive assistance for BLEs in and out of bed.  Pt tolerated therex well during session with some c/o L knee pain but reported therex "feels good" to R hip.   Per Dr. Martha Clan OK for pt to be [facility] for transfers to/from chair only, otherwise is to remain TDWB.  Pt tolerated transfer from bed to chair well with no signs of distress to her RLE.  Pt will benefit from PT services in a SNF setting upon discharge to safely address above deficits for decreased caregiver assistance and eventual return to PLOF.     Follow Up Recommendations  SNF     Equipment Recommendations  Other (comment)(TBD at next venue of care)    Recommendations for Other Services       Precautions / Restrictions Precautions Precautions: Fall Restrictions Weight Bearing Restrictions: Yes RLE Weight Bearing: Touchdown weight bearing(Per MD ok for PWB for transfers only, otherwise TWB) Other Position/Activity Restrictions: Per MD ok for PWB for transfers only, otherwise TWB    Mobility  Bed Mobility Overal bed mobility: Needs Assistance Bed Mobility: Sit to Supine;Supine to Sit     Supine to sit: Mod assist Sit to supine: Mod assist   General bed mobility comments: Mod A for BLEs in  and out of bed  Transfers Overall transfer level: Needs assistance Equipment used: Rolling walker (2 wheeled) Transfers: Sit to/from [employer] Sit to Stand: Min assist Stand pivot transfers: Min assist       General transfer comment: Extensive verbal and tactile cues for sequencing for PWB status during transfer from bed to chair  Ambulation/Gait             General Gait Details: Unable/unsafe to attempt secondary to pt's cognition and inability to maintain WB status   Stairs            Wheelchair Mobility    Modified Rankin (Stroke Patients Only)       Balance Overall balance assessment: Needs assistance Sitting-balance support: Feet supported;No upper extremity supported Sitting balance-Leahy Scale: Good     Standing balance support: Bilateral upper extremity supported Standing balance-Leahy Scale: Fair                              Cognition Arousal/Alertness: Awake/alert Behavior During Therapy: WFL for tasks assessed/performed Overall Cognitive Status: History of cognitive impairments - at baseline                                        Exercises Total Joint Exercises Ankle Circles/Pumps: AROM;Both;Strengthening;5 reps;10 reps Short Arc Quad: AROM;Both;5 reps;10 reps;AAROM Hip ABduction/ADduction: AROM;AAROM;Both;5 reps;10 reps Straight Leg  Raises: AROM;AAROM;Both;5 reps;10 reps Long Arc Quad: AROM;Both;5 reps;10 reps Knee Flexion: AROM;Both;5 reps;10 reps Marching in Standing: AAROM;AROM;Both;10 reps;Seated(Seated)    General Comments        Pertinent Vitals/Pain Pain Assessment: No/denies pain    Home Living                      Prior Function            PT Goals (current goals can now be found in the care plan section) Acute Rehab PT Goals PT Goal Formulation: Patient unable to participate in goal setting Time For Goal Achievement: 04/06/17 Potential to Achieve Goals:  Good Progress towards PT goals: Progressing toward goals    Frequency    BID      PT Plan Current plan remains appropriate    Co-evaluation              AM-PAC PT "6 Clicks" Daily Activity  Outcome Measure  Difficulty turning over in bed (including adjusting bedclothes, sheets and blankets)?: Unable Difficulty moving from lying on back to sitting on the side of the bed? : Unable Difficulty sitting down on and standing up from a chair with arms (e.g., wheelchair, bedside commode, etc,.)?: Unable Help needed moving to and from a bed to chair (including a wheelchair)?: A Lot Help needed walking in hospital room?: Total Help needed climbing 3-5 steps with a railing? : Total 6 Click Score: 7    End of Session Equipment Utilized During Treatment: Gait belt Activity Tolerance: Patient tolerated treatment well Patient left: in chair;with call bell/phone within reach;with chair alarm set;with family/visitor present Nurse Communication: Mobility status;Weight bearing status PT Visit Diagnosis: Other abnormalities of gait and mobility (R26.89);Muscle weakness (generalized) (M62.81)     Time: [id number] PT Time Calculation (min) (ACUTE ONLY): 38 min  Charges:  $Therapeutic Exercise: 23-37 mins $Therapeutic Activity: 8-22 mins                    G Codes:       Elly Modena. Scott Nelli Swalley PT, DPT 03/24/17, 2:36 PM

## 2017-03-24 NOTE — Progress Notes (Signed)
Subjective:  POD #1 s/p percutaneous fixation for right femoral neck hip fracture. Patient reports denies right hip pain resting in bed. Patient had physical therapy this morning but was unable to comply with toe-touch weightbearing restriction. Patient not yet been out of bed. Her daughter is at the bedside.  Objective:   VITALS:   Vitals:   03/23/17 2156 03/23/17 2358 03/24/17 0433 03/24/17 0829  BP: (!) 109/52 (!) 153/58 (!) 121/59 (!) 136/48  Pulse: 78 85 (!) 121 78  Resp: 16 16 16 20   Temp: 99.2 F (37.3 C) 98.2 F (36.8 C) 98.6 F (37 C) 99.3 F (37.4 C)  TempSrc: Oral Axillary Oral Oral  SpO2: 98% 96% 93% 96%  Weight:      Height:        PHYSICAL EXAM: Right lower extremity: Neurovascular intact Sensation intact distally Intact pulses distally Dorsiflexion/Plantar flexion intact Incision: dressing C/D/I No cellulitis present Compartment soft  LABS  Results for orders placed or performed during the hospital encounter of 03/22/17 (from the past 24 hour(s))  CBC     Status: Abnormal   Collection Time: 03/24/17  4:21 AM  Result Value Ref Range   WBC 7.8 3.6 - 11.0 K/uL   RBC 3.66 (L) 3.80 - 5.20 MIL/uL   Hemoglobin 11.2 (L) 12.0 - 16.0 g/dL   HCT 16.1 (L) 09.6 - 04.5 %   MCV 89.9 80.0 - 100.0 fL   MCH 30.6 26.0 - 34.0 pg   MCHC 34.0 32.0 - 36.0 g/dL   RDW 40.9 81.1 - 91.4 %   Platelets 215 150 - 440 K/uL  Basic metabolic panel     Status: Abnormal   Collection Time: 03/24/17  4:21 AM  Result Value Ref Range   Sodium 141 135 - 145 mmol/L   Potassium 3.5 3.5 - 5.1 mmol/L   Chloride 110 101 - 111 mmol/L   CO2 25 22 - 32 mmol/L   Glucose, Bld 96 65 - 99 mg/dL   BUN 14 6 - 20 mg/dL   Creatinine, Ser 7.82 0.44 - 1.00 mg/dL   Calcium 8.4 (L) 8.9 - 10.3 mg/dL   GFR calc non Af Amer >60 >60 mL/min   GFR calc Af Amer >60 >60 mL/min   Anion gap 6 5 - 15    Dg Pelvis 1-2 Views  Result Date: 03/22/2017 CLINICAL DATA:  Pain after trauma EXAM: PELVIS - 1-2 VIEW  COMPARISON:  None. FINDINGS: There is a subcapital fracture in the right hip without dislocation identified. There is a healed left inferior pubic ramus fracture. Left hip is grossly intact. Surgical hardware seen in the lower lumbar spine. IMPRESSION: 1. Subcapital right hip fracture. Electronically Signed   By: Gerome Sam III M.D   On: 03/22/2017 20:35   Ct Knee Left Wo Contrast  Result Date: 03/22/2017 CLINICAL DATA:  Knee pain after fall this evening. Right hip fracture. EXAM: CT OF THE left KNEE WITHOUT CONTRAST TECHNIQUE: Multidetector CT imaging of the left knee was performed according to the standard protocol. Multiplanar CT image reconstructions were also generated. COMPARISON:  None. FINDINGS: No fracture line or displaced fracture fragment identified. Advanced tricompartmental degenerative changes with associated joint space narrowings and osteophyte formation, most severely involving the medial compartment where there is near abutment of the medial femoral condyle and medial tibial plateau. Anterior cruciate ligament is not adequately visualized to assess integrity. Posterior cruciate ligament is grossly intact. No evidence of medial or lateral collateral ligament tear seen. Joint effusion.  Superficial soft tissues are unremarkable. IMPRESSION: 1. No acute osseous fracture or dislocation. 2. Joint effusion, small to moderate in size, predominantly localized to the suprapatellar bursa, of uncertain chronicity but most likely chronic given the underlying degenerative change. 3. Advanced degenerative osteoarthritis, most severely involving the medial compartment where there is near abutment of the medial femoral condyle and medial tibial plateau. Electronically Signed   By: Bary RichardStan  Maynard M.D.   On: 03/22/2017 22:32   Dg Knee Complete 4 Views Left  Result Date: 03/22/2017 CLINICAL DATA:  Pain after fall EXAM: LEFT KNEE - COMPLETE 4+ VIEW COMPARISON:  None. FINDINGS: No fracture or dislocation.  No joint effusion. Tricompartmental degenerative changes, most marked in the medial compartment. IMPRESSION: Degenerative changes.  No fracture or dislocation. Electronically Signed   By: Gerome Samavid  Williams III M.D   On: 03/22/2017 20:34   Dg Hip Port Unilat With Pelvis 1v Right  Result Date: 03/23/2017 CLINICAL DATA:  Right hip repair. EXAM: DG HIP (WITH OR WITHOUT PELVIS) 1V PORT RIGHT COMPARISON:  None. FINDINGS: Three screws been placed across the right sub capital fracture. IMPRESSION: Three screws been placed across the right subcapital fracture. Electronically Signed   By: Gerome Samavid  Williams III M.D   On: 03/23/2017 15:42   Dg Hip Operative Unilat W Or W/o Pelvis Right  Result Date: 03/23/2017 CLINICAL DATA:  Right hip pending FLUOROSCOPY TIME:  39 seconds EXAM: OPERATIVE RIGHT HIP (WITH PELVIS IF PERFORMED) to VIEWS TECHNIQUE: Fluoroscopic spot image(s) were submitted for interpretation post-operatively. COMPARISON:  March 22, 2017 FINDINGS: Three screws are placed across the subcapital right hip fracture. IMPRESSION: Three screws have been placed across the subcapital right hip fracture. Electronically Signed   By: Gerome Samavid  Williams III M.D   On: 03/23/2017 14:47   Dg Femur Min 2 Views Right  Result Date: 03/22/2017 CLINICAL DATA:  Bilateral knee pain after fall.  Right femoral pain. EXAM: RIGHT FEMUR 2 VIEWS COMPARISON:  Pelvis film April 29, 2015 FINDINGS: There is a subcapital fracture of the right hip. No other femoral fractures identified. Severe degenerative changes in the right knee. IMPRESSION: Subcapital fracture of the right hip. Electronically Signed   By: Gerome Samavid  Williams III M.D   On: 03/22/2017 20:33    Assessment/Plan: 1 Day Post-Op   Active Problems:   Hip fx (HCC)  Despite the patient's inability to comply with toe-touch weightbearing on the right lower extremity, I would prefer that she get up out of bed to a chair. I understand that she may need to partially weight-bear on  the right lower extremity in order to transfer to a chair. I feel the benefits of getting into a chair outweighed the potential risks of momentary partial weightbearing.  Patient will continue physical therapy as she can participate.  She is on Lovenox for DVT prophylaxis.    Juanell FairlyKRASINSKI, Ison Wichmann , MD 03/24/2017, 1:19 PM

## 2017-03-24 NOTE — Clinical Social Work Note (Signed)
Clinical Social Work Assessment  Patient Details  Name: Katie Fitzgerald MRN: [id number] Date of Birth: 01-23-1941  Date of referral:  03/24/17               Reason for consult:  Facility Placement                Permission sought to share information with:  Chartered certified accountant granted to share information::  Yes, Verbal Permission Granted  Name::      [place]::   [place]   Relationship::     Contact Information:     Housing/Transportation Living arrangements for the past 2 months:  [place] of Information:  Patient, Adult Children Patient Interpreter Needed:  None Criminal Activity/Legal Involvement Pertinent to Current Situation/Hospitalization:  No - Comment as needed Significant Relationships:  Adult Children Lives with:  Self Do you feel safe going back to the place where you live?  Yes Need for family participation in patient care:  Yes (Comment)  Care giving concerns:  Patient lives in [place] alone.    Social Worker assessment / plan:  Holiday representative (CSW) received SNF consult. PT is recommending SNF. CSW met with patient and her daughter Gerald Stabs [phone] was at bedside. Patient was alert and oriented X3 and was laying in the bed. CSW introduced self and explained role of CSW department. Daughter reported that patient lives alone and they are interested in SNF placement for rehab. CSW explained SNF process and that [facility] will have to approve it. FL2 complete and faxed out.   CSW presented bed offers to patient and daughter Gerald Stabs. They chose [facility]. Per Seth Bake admissions coordinator at [facility] they have a private room and she will start [facility] authorization today. CSW will continue to follow and assist as needed.   Employment status:  Disabled (Comment on whether or not currently receiving Disability), Retired Nurse, adult PT Recommendations:  [place] / Referral to community resources:  [place]  Patient/Family's Response to care:  Patient and her daughter are agreeable to D/C to [facility].   Patient/Family's Understanding of and Emotional Response to Diagnosis, Current Treatment, and Prognosis:  Patient and her daughter were very pleasant and thanked CSW for assistance.   Emotional Assessment Appearance:  Appears stated age Attitude/Demeanor/Rapport:    Affect (typically observed):  Accepting, Pleasant Orientation:  Oriented to Self, Oriented to Place, Oriented to  Time, Fluctuating Orientation (Suspected and/or reported Sundowners) Alcohol / Substance use:  Not Applicable Psych involvement (Current and /or in the community):  No (Comment)  Discharge Needs  Concerns to be addressed:  Discharge Planning Concerns Readmission within the last 30 days:  No Current discharge risk:  Dependent with Mobility Barriers to Discharge:  Continued Medical Work up   [employer], Veronia Beets, LCSW 03/24/2017, 5:22 PM

## 2017-03-25 DIAGNOSIS — S72001A Fracture of unspecified part of neck of right femur, initial encounter for closed fracture: Secondary | ICD-10-CM

## 2017-03-25 DIAGNOSIS — R441 Visual hallucinations: Secondary | ICD-10-CM

## 2017-03-25 DIAGNOSIS — F39 Unspecified mood [affective] disorder: Secondary | ICD-10-CM

## 2017-03-25 DIAGNOSIS — G301 Alzheimer's disease with late onset: Secondary | ICD-10-CM

## 2017-03-25 DIAGNOSIS — I10 Essential (primary) hypertension: Secondary | ICD-10-CM

## 2017-03-25 LAB — BASIC METABOLIC PANEL
Anion gap: 7 (ref 5–15)
BUN: 15 mg/dL (ref 6–20)
CALCIUM: 8.2 mg/dL — AB (ref 8.9–10.3)
CHLORIDE: 111 mmol/L (ref 101–111)
CO2: 24 mmol/L (ref 22–32)
CREATININE: 0.72 mg/dL (ref 0.44–1.00)
GFR calc Af Amer: >60 mL/min (ref >60)
GFR calc non Af Amer: >60 mL/min (ref >60)
Glucose, Bld: 91 mg/dL (ref 65–99)
Potassium: 3.1 mmol/L — ABNORMAL LOW (ref 3.5–5.1)
SODIUM: 142 mmol/L (ref 135–145)

## 2017-03-25 LAB — CBC
HCT: 33 % — ABNORMAL LOW (ref 35.0–47.0)
HEMOGLOBIN: 11 g/dL — AB (ref 12.0–16.0)
MCH: 30 pg (ref 26.0–34.0)
MCHC: 33.4 g/dL (ref 32.0–36.0)
MCV: 90 fL (ref 80.0–100.0)
Platelets: 217 10*3/uL (ref 150–440)
RBC: 3.66 MIL/uL — ABNORMAL LOW (ref 3.80–5.20)
RDW: 14.2 % (ref 11.5–14.5)
WBC: 6.8 10*3/uL (ref 3.6–11.0)

## 2017-03-25 LAB — MAGNESIUM: Magnesium: 2 mg/dL (ref 1.7–2.4)

## 2017-03-25 MED ORDER — ENOXAPARIN SODIUM 40 MG/0.4ML ~~LOC~~ SOLN: 40.0000 mg | SUBCUTANEOUS | 0 refills | Status: DC

## 2017-03-25 MED ORDER — POLYETHYLENE GLYCOL 3350 17 G PO PACK: 17.0000 g | PACK | Freq: Every day | ORAL | 0 refills | Status: AC | PRN

## 2017-03-25 MED ORDER — POTASSIUM CHLORIDE CRYS ER 20 MEQ PO TBCR
40.0000 meq | EXTENDED_RELEASE_TABLET | Freq: Two times a day (BID) | ORAL | Status: DC
  Administered 2017-03-25: 40 meq via ORAL
  Filled 2017-03-25: qty 2

## 2017-03-25 MED ORDER — GABAPENTIN 600 MG PO TABS: 300.0000 mg | ORAL_TABLET | Freq: Two times a day (BID) | ORAL | 0 refills | Status: AC

## 2017-03-25 MED ORDER — ACETAMINOPHEN 325 MG PO TABS: 650.0000 mg | ORAL_TABLET | Freq: Four times a day (QID) | ORAL | 0 refills | Status: AC | PRN

## 2017-03-25 MED ORDER — DOCUSATE SODIUM 100 MG PO CAPS: 100.0000 mg | ORAL_CAPSULE | Freq: Two times a day (BID) | ORAL | 0 refills | Status: AC

## 2017-03-25 MED ORDER — HYDROCODONE-ACETAMINOPHEN 5-325 MG PO TABS: 1.0000 | ORAL_TABLET | Freq: Four times a day (QID) | ORAL | 0 refills | Status: DC | PRN

## 2017-03-25 MED ORDER — SENNA 8.6 MG PO TABS: 1.0000 | ORAL_TABLET | Freq: Two times a day (BID) | ORAL | 0 refills | Status: AC

## 2017-03-25 MED ORDER — CITALOPRAM HYDROBROMIDE 20 MG PO TABS: 20.0000 mg | ORAL_TABLET | Freq: Every day | ORAL | 0 refills | Status: AC

## 2017-03-25 MED ORDER — POTASSIUM CHLORIDE CRYS ER 20 MEQ PO TBCR: 20.0000 meq | EXTENDED_RELEASE_TABLET | Freq: Every day | ORAL | 0 refills | Status: AC

## 2017-03-25 NOTE — Progress Notes (Signed)
Pt in no acute distress. VSS. Family at bedside. Report called to [facility]. EMS called to transport patient.

## 2017-03-25 NOTE — Progress Notes (Signed)
Per Sue LushAndrea admissions coordinator at [facility] has been received and patient can come today to room 325. RN will call report at [phone] and arrange EMS for transport. Clinical Child psychotherapistocial Worker (CSW) sent [facility]. Patient is aware of above. Patient's daughter Thayer OhmChris is at bedside and aware of above. Please reconsult if future social work needs arise. CSW signing off.   [employer]Bailey Suleyman Ehrman, LCSW [phone]

## 2017-03-25 NOTE — Progress Notes (Signed)
Subjective:  POD #2 s/p percutaneous fixation for right femoral neck hip fracture. Patient reports right hip pain pain as mild.  Patient having pain in both knees due to advanced osteoarthritis. No other acute issues. Patient's family is at the bedside. Patient has advanced dementia and is unable to provide accurate history.  Objective:   VITALS:   Vitals:   03/24/17 2046 03/25/17 0457 03/25/17 0753 03/25/17 1154  BP: (!) 114/44 (!) 148/76 (!) 149/61 (!) 109/58  Pulse: 75 82 86 79  Resp: 19 18 20    Temp: 99.1 F (37.3 C) 98.6 F (37 C) 98.2 F (36.8 C) 98.4 F (36.9 C)  TempSrc: Oral Oral Oral Oral  SpO2: 97% 94% 98% 99%  Weight:      Height:        PHYSICAL EXAM: Right lower extremity: Neurovascular intact Sensation intact distally Intact pulses distally Dorsiflexion/Plantar flexion intact Incision: dressing C/D/I No cellulitis present Compartment soft  LABS  Results for orders placed or performed during the hospital encounter of 03/22/17 (from the past 24 hour(s))  CBC     Status: Abnormal   Collection Time: 03/25/17  3:04 AM  Result Value Ref Range   WBC 6.8 3.6 - 11.0 K/uL   RBC 3.66 (L) 3.80 - 5.20 MIL/uL   Hemoglobin 11.0 (L) 12.0 - 16.0 g/dL   HCT 16.1 (L) 09.6 - 04.5 %   MCV 90.0 80.0 - 100.0 fL   MCH 30.0 26.0 - 34.0 pg   MCHC 33.4 32.0 - 36.0 g/dL   RDW 40.9 81.1 - 91.4 %   Platelets 217 150 - 440 K/uL  Basic metabolic panel     Status: Abnormal   Collection Time: 03/25/17  3:04 AM  Result Value Ref Range   Sodium 142 135 - 145 mmol/L   Potassium 3.1 (L) 3.5 - 5.1 mmol/L   Chloride 111 101 - 111 mmol/L   CO2 24 22 - 32 mmol/L   Glucose, Bld 91 65 - 99 mg/dL   BUN 15 6 - 20 mg/dL   Creatinine, Ser 7.82 0.44 - 1.00 mg/dL   Calcium 8.2 (L) 8.9 - 10.3 mg/dL   GFR calc non Af Amer >60 >60 mL/min   GFR calc Af Amer >60 >60 mL/min   Anion gap 7 5 - 15  Magnesium     Status: None   Collection Time: 03/25/17  3:04 AM  Result Value Ref Range   Magnesium 2.0 1.7 - 2.4 mg/dL    Dg Hip Port Unilat With Pelvis 1v Right  Result Date: 03/23/2017 CLINICAL DATA:  Right hip repair. EXAM: DG HIP (WITH OR WITHOUT PELVIS) 1V PORT RIGHT COMPARISON:  None. FINDINGS: Three screws been placed across the right sub capital fracture. IMPRESSION: Three screws been placed across the right subcapital fracture. Electronically Signed   By: Gerome Sam III M.D   On: 03/23/2017 15:42   Dg Hip Operative Unilat W Or W/o Pelvis Right  Result Date: 03/23/2017 CLINICAL DATA:  Right hip pending FLUOROSCOPY TIME:  39 seconds EXAM: OPERATIVE RIGHT HIP (WITH PELVIS IF PERFORMED) to VIEWS TECHNIQUE: Fluoroscopic spot image(s) were submitted for interpretation post-operatively. COMPARISON:  March 22, 2017 FINDINGS: Three screws are placed across the subcapital right hip fracture. IMPRESSION: Three screws have been placed across the subcapital right hip fracture. Electronically Signed   By: Gerome Sam III M.D   On: 03/23/2017 14:47    Assessment/Plan: 2 Days Post-Op   Active Problems:   Hip fx (HCC)  Patient is leaving to [facility].  Patient will remain toe-touch weightbearing on the right lower extremity 6 weeks postop. Patient will follow up with me in the office in 1 week for wound check, staple removal and x-ray. Patient should have Lovenox for DVT prophylaxis.  Continue current pain management.    Juanell FairlyKRASINSKI, Terence Bart , MD 03/25/2017, 12:18 PM

## 2017-03-25 NOTE — Progress Notes (Signed)
Physical Therapy Treatment Patient Details Name: Katie MainsDianne V Fitzgerald MRN: [id number] DOB: 12/07/1940 Today's Date: 03/25/2017    History of Present Illness Pt is a76 y.o.femalewith a known history of dementia, HTN, and OA who presented to the emergency room status post mechanical fall/tripping over dog.  ER workup noted for right hip fracture, patient also complained of left knee pain with CT of the knee negative for fracture but positive for advanced degenerative joint disease.  The patient was admitted for acute right hip fracture and acute left knee pain and is s/p R hip cannulated pinning.    PT Comments    Pt presents with deficits in strength, transfers, mobility, gait, balance, and activity tolerance.  Pt required only SBA during bed mobility tasks this session with extra time and effort needed along with extensive cues for sequencing.  Pt required min A during transfers along with extensive verbal and tactile cues for sequencing to limit pt's WB through the RLE.  Pt able to follow simple commands during session with extensive cueing but is really unable to follow more complex commands required to maintain TTWB to the RLW so ambulation continues to be deferred at this time.  Pt will benefit from PT services in a SNF setting upon discharge to safely address above deficits for decreased caregiver assistance and eventual return to PLOF.     Follow Up Recommendations  SNF     Equipment Recommendations  Other (comment)(TBD at next venue of care)    Recommendations for Other Services       Precautions / Restrictions Precautions Precautions: Fall Restrictions Weight Bearing Restrictions: Yes RLE Weight Bearing: Touchdown weight bearing Other Position/Activity Restrictions: Per MD ok for RLE PWB for transfers only, otherwise RLE to be TTWB    Mobility  Bed Mobility Overal bed mobility: Needs Assistance Bed Mobility: Sit to Supine;Supine to Sit     Supine to sit: Supervision Sit  to supine: Supervision   General bed mobility comments: Increased time and effort during bed mobility tasks but no physical assistance required  Transfers Overall transfer level: Needs assistance Equipment used: Rolling walker (2 wheeled) Transfers: Sit to/from [employer]Stand;Stand Pivot Transfers Sit to Stand: Min assist Stand pivot transfers: Min assist       General transfer comment: Extensive verbal and tactile cues for sequencing for PWB status during transfer from bed to chair  Ambulation/Gait             General Gait Details: Unable/unsafe to attempt secondary to pt's cognition and inability to maintain WB status   Stairs            Wheelchair Mobility    Modified Rankin (Stroke Patients Only)       Balance Overall balance assessment: Needs assistance Sitting-balance support: Feet supported;No upper extremity supported Sitting balance-Leahy Scale: Good     Standing balance support: Bilateral upper extremity supported Standing balance-Leahy Scale: Fair                              Cognition Arousal/Alertness: Awake/alert Behavior During Therapy: WFL for tasks assessed/performed Overall Cognitive Status: History of cognitive impairments - at baseline                                        Exercises Total Joint Exercises Ankle Circles/Pumps: Strengthening;Both;5 reps;10 reps(Verbal and tactile cues required for therex  for technique) Quad Sets: Strengthening;Both;5 reps;10 reps Heel Slides: AAROM;Both;5 reps;10 reps Hip ABduction/ADduction: AROM;AAROM;Both;5 reps;10 reps Straight Leg Raises: AROM;AAROM;Both;5 reps;10 reps Long Arc Quad: AROM;Both;10 reps;15 reps Knee Flexion: AROM;Both;10 reps;15 reps Marching in Standing: AROM;Both;5 reps;10 reps;Seated    General Comments        Pertinent Vitals/Pain Pain Assessment: Faces Faces Pain Scale: Hurts a little bit Pain Location: L knee, no pain to R hip during session Pain  Descriptors / Indicators: Sore Pain Intervention(s): Monitored during session;Limited activity within patient's tolerance    Home Living                      Prior Function            PT Goals (current goals can now be found in the care plan section) Progress towards PT goals: Progressing toward goals    Frequency    BID      PT Plan Current plan remains appropriate    Co-evaluation              AM-PAC PT "6 Clicks" Daily Activity  Outcome Measure                   End of Session Equipment Utilized During Treatment: Gait belt Activity Tolerance: Patient tolerated treatment well Patient left: in chair;with call bell/phone within reach;with chair alarm set;with family/visitor present Nurse Communication: Mobility status PT Visit Diagnosis: Other abnormalities of gait and mobility (R26.89);Muscle weakness (generalized) (M62.81)     Time: 9604-5409 PT Time Calculation (min) (ACUTE ONLY): 24 min  Charges:  $Therapeutic Exercise: 8-22 mins $Therapeutic Activity: 8-22 mins                    G Codes:       DElly Modena PT, DPT 03/25/17, 11:45 AM

## 2017-03-25 NOTE — Clinical Social Work Placement (Signed)
   CLINICAL SOCIAL WORK PLACEMENT  NOTE  Date:  03/25/2017  Patient Details  Name: Katie Fitzgerald MRN: [id number] Date of Birth: 07/02/1940  Clinical Social Work is seeking post-discharge placement for this patient at the Skilled  Nursing Facility level of care (*CSW will initial, date and re-position this form in  chart as items are completed):  Yes   Patient/family provided with [place] Clinical Social Work Department's list of facilities offering this level of care within the geographic area requested by the patient (or if unable, by the patient's family).  Yes   Patient/family informed of their freedom to choose among providers that offer the needed level of care, that participate in Medicare, Medicaid or managed care program needed by the patient, have an available bed and are willing to accept the patient.  Yes   Patient/family informed of [facility] ownership interest in [facility] and [facility], as well as of the fact that they are under no obligation to receive care at these facilities.  PASRR submitted to EDS on       PASRR number received on       Existing PASRR number confirmed on 03/24/17     FL2 transmitted to all facilities in geographic area requested by pt/family on 03/24/17     FL2 transmitted to all facilities within larger geographic area on       Patient informed that his/her managed care company has contracts with or will negotiate with certain facilities, including the following:        Yes   Patient/family informed of bed offers received.  Patient chooses bed at [facility] )     Physician recommends and patient chooses bed at      Patient to be transferred to [facility] ) on 03/25/17.  Patient to be transferred to facility by [facility] )     Patient family notified on 03/25/17 of transfer.  Name of family member notified:  (Patient's daughter Thayer OhmChris is aware of D/C today. )     PHYSICIAN       Additional  Comment:    _______________________________________________ Shirl Weir, Darleen CrockerBailey M, LCSW 03/25/2017, 11:41 AM

## 2017-03-25 NOTE — Care Management Important Message (Signed)
Important Message  Patient Details  Name: Katie Fitzgerald MRN: [id number] Date of Birth: 12/25/1940   Medicare Important Message Given:  Yes    Collie SiadAngela Evalise Abruzzese, RN 03/25/2017, 7:27 AM

## 2017-03-25 NOTE — Discharge Summary (Signed)
[facility] - [place] at [facility]   PATIENT NAME: Katie Fitzgerald    MR#:  [id number]  DATE OF BIRTH:  March 17, 1940  DATE OF ADMISSION:  03/22/2017 ADMITTING PHYSICIAN: Bertrum Sol, MD  DATE OF DISCHARGE: 03/25/2017  PRIMARY CARE PHYSICIAN: Patient, No Pcp Per    ADMISSION DIAGNOSIS:  Closed right hip fracture, initial encounter (HCC) [S72.001A] Acute pain of left knee [M25.562]  DISCHARGE DIAGNOSIS:  Active Problems:   Hip fx (HCC)   SECONDARY DIAGNOSIS:   Past Medical History:  Diagnosis Date  . Dementia   . Hypertension   . Osteoarthrosis     HOSPITAL COURSE:   1acute right hip fracture status post mechanical fall  Status post surgery, pain management and DVT prophylaxis as per orthopedic team. PT evaluation,  need rehabilitation.  2chronic dementia Stable Continue home regiment  3chronic benign essential hypertension Exacerbated by above PRN hydralazine IV for systolic blood pressure greater than 160  4chronic osteoarthritis Stable  5. Hypokalemia   Replace oral.  DISCHARGE CONDITIONS:   Stable.  CONSULTS OBTAINED:  Treatment Team:  Juanell Fairly, MD  DRUG ALLERGIES:   Allergies  Allergen Reactions  . Amoxicillin     Other reaction(s): Unknown  . Penicillin G     Other reaction(s): Unknown    DISCHARGE MEDICATIONS:   Allergies as of 03/25/2017      Reactions   Amoxicillin    Other reaction(s): Unknown   Penicillin G    Other reaction(s): Unknown      Medication List    TAKE these medications   acetaminophen 325 MG tablet Commonly known as:  TYLENOL Take 2 tablets (650 mg total) by mouth every 6 (six) hours as needed for mild pain (or Fever >/= 101).   citalopram 20 MG tablet Commonly known as:  CELEXA Take 1 tablet (20 mg total) by mouth daily. What changed:    how much to take  when to take this  additional instructions   docusate sodium 100 MG capsule Commonly known as:   COLACE Take 1 capsule (100 mg total) by mouth 2 (two) times daily.   enoxaparin 40 MG/0.4ML injection Commonly known as:  LOVENOX Inject 0.4 mLs (40 mg total) into the skin daily. Start taking on:  03/26/2017   gabapentin 600 MG tablet Commonly known as:  NEURONTIN Take 0.5 tablets (300 mg total) by mouth 2 (two) times daily.   HYDROcodone-acetaminophen 5-325 MG tablet Commonly known as:  NORCO/VICODIN Take 1 tablet by mouth every 6 (six) hours as needed for moderate pain.   memantine 10 MG tablet Commonly known as:  NAMENDA Take 10 mg by mouth 2 (two) times daily.   polyethylene glycol packet Commonly known as:  MIRALAX / GLYCOLAX Take 17 g by mouth daily as needed for mild constipation.   potassium chloride SA 20 MEQ tablet Commonly known as:  K-DUR,KLOR-CON Take 1 tablet (20 mEq total) by mouth daily.   senna 8.6 MG Tabs tablet Commonly known as:  SENOKOT Take 1 tablet (8.6 mg total) by mouth 2 (two) times daily.        DISCHARGE INSTRUCTIONS:    Follow with Ortho clinic in 2 weeks.  If you experience worsening of your admission symptoms, develop shortness of breath, life threatening emergency, suicidal or homicidal thoughts you must seek medical attention immediately by calling 911 or calling your MD immediately  if symptoms less severe.  You Must read complete instructions/literature along with all the possible adverse reactions/side effects  for all the Medicines you take and that have been prescribed to you. Take any new Medicines after you have completely understood and accept all the possible adverse reactions/side effects.   Please note  You were cared for by a hospitalist during your hospital stay. If you have any questions about your discharge medications or the care you received while you were in the hospital after you are discharged, you can call the unit and asked to speak with the hospitalist on call if the hospitalist that took care of you is not  available. Once you are discharged, your primary care physician will handle any further medical issues. Please note that NO REFILLS for any discharge medications will be authorized once you are discharged, as it is imperative that you return to your primary care physician (or establish a relationship with a primary care physician if you do not have one) for your aftercare needs so that they can reassess your need for medications and monitor your lab values.    Today   CHIEF COMPLAINT:   Chief Complaint  Patient presents with  . Fall    HISTORY OF PRESENT ILLNESS:  Katie Fitzgerald  is a 77 y.o. female presents to emergency room status post mechanical fall/tripping over dog, ER workup noted for right hip fracture, patient also complaining of left knee pain-CT of the knee is pending at this time, patient evaluated at the bedside, daughter present who is the primary caregiver, patient now being admitted for acute right hip fracture, acute left knee pain status post mechanical fall.   VITAL SIGNS:  Blood pressure (!) 149/61, pulse 86, temperature 98.2 F (36.8 C), temperature source Oral, resp. rate 20, height 5\' 4"  (1.626 m), weight 51.7 kg (114 lb), SpO2 98 %.  I/O:    Intake/Output Summary (Last 24 hours) at 03/25/2017 1108 Last data filed at 03/24/2017 1900 Gross per 24 hour  Intake 240 ml  Output 170 ml  Net 70 ml    PHYSICAL EXAMINATION:   GENERAL:  77 y.o.-year-old patient lying in the bed with no acute distress.  EYES: Pupils equal, round, reactive to light and accommodation. No scleral icterus. Extraocular muscles intact.  HEENT: Head atraumatic, normocephalic. Oropharynx and nasopharynx clear.  NECK:  Supple, no jugular venous distention. No thyroid enlargement, no tenderness.  LUNGS: Normal breath sounds bilaterally, no wheezing, rales,rhonchi or crepitation. No use of accessory muscles of respiration.  CARDIOVASCULAR: S1, S2 normal. No murmurs, rubs, or gallops.  ABDOMEN:  Soft, nontender, nondistended. Bowel sounds present. No organomegaly or mass.  EXTREMITIES: No pedal edema, cyanosis, or clubbing.  NEUROLOGIC: Cranial nerves II through XII are intact. Muscle strength 5/5 in all extremities except right hip which is painful , s/p surgery and she is not moving lower extremity much. Sensation intact. Gait not checked.  PSYCHIATRIC: The patient is alert and oriented x 1. Have baseline dementia. SKIN: No obvious rash, lesion, or ulcer.     DATA REVIEW:   CBC Recent Labs  Lab 03/25/17 0304  WBC 6.8  HGB 11.0*  HCT 33.0*  PLT 217    Chemistries  Recent Labs  Lab 03/25/17 0304  NA 142  K 3.1*  CL 111  CO2 24  GLUCOSE 91  BUN 15  CREATININE 0.72  CALCIUM 8.2*  MG 2.0    Cardiac Enzymes No results for input(s): TROPONINI in the last 168 hours.  Microbiology Results  Results for orders placed or performed during the hospital encounter of 03/22/17  MRSA PCR  Screening     Status: None   Collection Time: 03/22/17 11:26 PM  Result Value Ref Range Status   MRSA by PCR NEGATIVE NEGATIVE Final    Comment:        The GeneXpert MRSA Assay (FDA approved for NASAL specimens only), is one component of a comprehensive MRSA colonization surveillance program. It is not intended to diagnose MRSA infection nor to guide or monitor treatment for MRSA infections. Performed at [facility], [address] [place], [place] [address]     RADIOLOGY:  Dg Hip Port Unilat With Pelvis 1v Right  Result Date: 03/23/2017 CLINICAL DATA:  Right hip repair. EXAM: DG HIP (WITH OR WITHOUT PELVIS) 1V PORT RIGHT COMPARISON:  None. FINDINGS: Three screws been placed across the right sub capital fracture. IMPRESSION: Three screws been placed across the right subcapital fracture. Electronically Signed   By: Gerome Samavid  Williams III M.D   On: 03/23/2017 15:42   Dg Hip Operative Unilat W Or W/o Pelvis Right  Result Date: 03/23/2017 CLINICAL DATA:  Right hip  pending FLUOROSCOPY TIME:  39 seconds EXAM: OPERATIVE RIGHT HIP (WITH PELVIS IF PERFORMED) to VIEWS TECHNIQUE: Fluoroscopic spot image(s) were submitted for interpretation post-operatively. COMPARISON:  March 22, 2017 FINDINGS: Three screws are placed across the subcapital right hip fracture. IMPRESSION: Three screws have been placed across the subcapital right hip fracture. Electronically Signed   By: Gerome Samavid  Williams III M.D   On: 03/23/2017 14:47    EKG:   Orders placed or performed during the hospital encounter of 04/29/15  . ED EKG  . ED EKG  . EKG 12-Lead  . EKG 12-Lead      Management plans discussed with the patient, family and they are in agreement.  CODE STATUS:     Code Status Orders  (From admission, onward)        Start     Ordered   03/22/17 2233  Full code  Continuous     03/22/17 2233    Code Status History    Date Active Date Inactive Code Status Order ID Comments User Context   04/29/2015 15:51 05/01/2015 19:21 Full Code [id number]  Shaune Pollackhen, Qing, MD Inpatient    Advance Directive Documentation     Most Recent Value  Type of Advance Directive  Healthcare Power of Attorney  Pre-existing out of facility DNR order (yellow form or pink MOST form)  No data  "MOST" Form in Place?  No data      TOTAL TIME TAKING CARE OF THIS PATIENT: 35 minutes.    Altamese DillingVaibhavkumar Imagene Boss M.D on 03/25/2017 at 11:08 AM  Between 7am to 6pm - Pager - [phone]  After 6pm go to [email or web address] - password [email or web address]  [facility]  Office  [phone]  CC: Primary care physician; Patient, No Pcp Per   Note: This dictation was prepared with Dragon dictation along with smaller phrase technology. Any transcriptional errors that result from this process are unintentional.

## 2017-09-08 ENCOUNTER — Other Ambulatory Visit: Payer: Self-pay

## 2017-09-08 ENCOUNTER — Inpatient Hospital Stay
Admission: EM | Admit: 2017-09-08 | Discharge: 2017-09-11 | DRG: 481 | Disposition: A | Discharge status: Skilled Nursing Facility | Payer: Medicare Other | Attending: Internal Medicine | Admitting: Internal Medicine

## 2017-09-08 ENCOUNTER — Emergency Department: Payer: Medicare Other

## 2017-09-08 ENCOUNTER — Encounter: Payer: Self-pay | Admitting: Emergency Medicine

## 2017-09-08 DIAGNOSIS — Z79899 Other long term (current) drug therapy: Secondary | ICD-10-CM | POA: Diagnosis not present

## 2017-09-08 DIAGNOSIS — D62 Acute posthemorrhagic anemia: Secondary | ICD-10-CM | POA: Diagnosis not present

## 2017-09-08 DIAGNOSIS — F329 Major depressive disorder, single episode, unspecified: Secondary | ICD-10-CM | POA: Diagnosis present

## 2017-09-08 DIAGNOSIS — I959 Hypotension, unspecified: Secondary | ICD-10-CM | POA: Diagnosis present

## 2017-09-08 DIAGNOSIS — F0390 Unspecified dementia without behavioral disturbance: Secondary | ICD-10-CM | POA: Diagnosis present

## 2017-09-08 DIAGNOSIS — Z881 Allergy status to other antibiotic agents status: Secondary | ICD-10-CM | POA: Diagnosis not present

## 2017-09-08 DIAGNOSIS — I10 Essential (primary) hypertension: Secondary | ICD-10-CM | POA: Diagnosis present

## 2017-09-08 DIAGNOSIS — I493 Ventricular premature depolarization: Secondary | ICD-10-CM | POA: Diagnosis present

## 2017-09-08 DIAGNOSIS — W1830XA Fall on same level, unspecified, initial encounter: Secondary | ICD-10-CM | POA: Diagnosis present

## 2017-09-08 DIAGNOSIS — Z880 Allergy status to penicillin: Secondary | ICD-10-CM | POA: Diagnosis not present

## 2017-09-08 DIAGNOSIS — Y92009 Unspecified place in unspecified non-institutional (private) residence as the place of occurrence of the external cause: Secondary | ICD-10-CM

## 2017-09-08 DIAGNOSIS — Z01810 Encounter for preprocedural cardiovascular examination: Secondary | ICD-10-CM | POA: Diagnosis not present

## 2017-09-08 DIAGNOSIS — S72142A Displaced intertrochanteric fracture of left femur, initial encounter for closed fracture: Secondary | ICD-10-CM | POA: Diagnosis present

## 2017-09-08 DIAGNOSIS — Z419 Encounter for procedure for purposes other than remedying health state, unspecified: Secondary | ICD-10-CM

## 2017-09-08 DIAGNOSIS — W19XXXA Unspecified fall, initial encounter: Secondary | ICD-10-CM

## 2017-09-08 DIAGNOSIS — Z981 Arthrodesis status: Secondary | ICD-10-CM | POA: Diagnosis not present

## 2017-09-08 DIAGNOSIS — S72009A Fracture of unspecified part of neck of unspecified femur, initial encounter for closed fracture: Secondary | ICD-10-CM | POA: Diagnosis present

## 2017-09-08 DIAGNOSIS — G629 Polyneuropathy, unspecified: Secondary | ICD-10-CM | POA: Diagnosis present

## 2017-09-08 LAB — CBC WITH DIFFERENTIAL/PLATELET
Basophils Absolute: 0.1 10*3/uL (ref 0–0.1)
Basophils Relative: 1 %
EOS ABS: 0.2 10*3/uL (ref 0–0.7)
EOS PCT: 2 %
HCT: 36 % (ref 35.0–47.0)
Hemoglobin: 12 g/dL (ref 12.0–16.0)
Lymphocytes Relative: 28 %
Lymphs Abs: 2.5 10*3/uL (ref 1.0–3.6)
MCH: 31.6 pg (ref 26.0–34.0)
MCHC: 33.2 g/dL (ref 32.0–36.0)
MCV: 95.1 fL (ref 80.0–100.0)
Monocytes Absolute: 0.6 10*3/uL (ref 0.2–0.9)
Monocytes Relative: 7 %
Neutro Abs: 5.4 10*3/uL (ref 1.4–6.5)
Neutrophils Relative %: 62 %
PLATELETS: 315 10*3/uL (ref 150–440)
RBC: 3.79 MIL/uL — AB (ref 3.80–5.20)
RDW: 14 % (ref 11.5–14.5)
WBC: 8.7 10*3/uL (ref 3.6–11.0)

## 2017-09-08 LAB — COMPREHENSIVE METABOLIC PANEL
ALK PHOS: 68 U/L (ref 38–126)
ALT: 17 U/L (ref 0–44)
AST: 20 U/L (ref 15–41)
Albumin: 3.7 g/dL (ref 3.5–5.0)
Anion gap: 9 (ref 5–15)
BILIRUBIN TOTAL: 0.6 mg/dL (ref 0.3–1.2)
BUN: 20 mg/dL (ref 8–23)
CALCIUM: 8.8 mg/dL — AB (ref 8.9–10.3)
CO2: 25 mmol/L (ref 22–32)
CREATININE: 0.72 mg/dL (ref 0.44–1.00)
Chloride: 107 mmol/L (ref 98–111)
GFR calc Af Amer: >60 mL/min (ref >60)
Glucose, Bld: 138 mg/dL — ABNORMAL HIGH (ref 70–99)
Potassium: 3.7 mmol/L (ref 3.5–5.1)
Sodium: 141 mmol/L (ref 135–145)
TOTAL PROTEIN: 6.5 g/dL (ref 6.5–8.1)

## 2017-09-08 LAB — APTT: APTT: 24 s (ref 24–36)

## 2017-09-08 MED ORDER — LABETALOL HCL 5 MG/ML IV SOLN: 10.0000 mg | Freq: Once | INTRAVENOUS | Status: DC

## 2017-09-08 MED ORDER — FENTANYL CITRATE (PF) 100 MCG/2ML IJ SOLN
50.0000 ug | Freq: Once | INTRAMUSCULAR | Status: AC
  Administered 2017-09-08: 50 ug via INTRAVENOUS
  Filled 2017-09-08: qty 2

## 2017-09-08 MED ORDER — CLINDAMYCIN PHOSPHATE 300 MG/50ML IV SOLN
300.0000 mg | Freq: Once | INTRAVENOUS | Status: AC
  Administered 2017-09-09: 300 mg via INTRAVENOUS
  Filled 2017-09-08: qty 50

## 2017-09-08 MED ORDER — ONDANSETRON HCL 4 MG/2ML IJ SOLN
4.0000 mg | Freq: Once | INTRAMUSCULAR | Status: AC
  Administered 2017-09-08: 4 mg via INTRAVENOUS
  Filled 2017-09-08: qty 2

## 2017-09-08 NOTE — ED Notes (Signed)
Patient transported to 153 by this EDT.

## 2017-09-08 NOTE — ED Triage Notes (Addendum)
PT to ED via EMS from with c/o mechanical fall. PT hx of dementia. No head injury , no LOC. Pt c/o LFT hip pain, + rotation and + shortening. PT received 100mcg fentanyl and 4mg  zofran in route. VSS

## 2017-09-08 NOTE — ED Provider Notes (Signed)
[facility] Emergency Department Provider Note  ___________________________________________   None    (approximate)  I have reviewed the triage vital signs and the nursing notes.   HISTORY  Chief Complaint Fall   HPI Katie Fitzgerald is a 77 y.o. female who presents to the emergency department for treatment and evaluation after a mechanical, non-syncopal fall.  She has a history of dementia.  Per report, the fall was witnessed and there was no head injury and no loss of consciousness.  She complains of left hip pain.  There is noted rotation and shortening of the left lower extremity.  In route, she received fentanyl and Zofran.   Past Medical History:  Diagnosis Date  . Dementia   . Hypertension   . Osteoarthrosis     Patient Active Problem List   Diagnosis Date Noted  . Hip fx (HCC) 03/22/2017  . Syncope 04/29/2015  . Fracture of left inferior pubic ramus (HCC) 04/29/2015    Past Surgical History:  Procedure Laterality Date  . HIP PINNING,CANNULATED Right 03/23/2017   Procedure: CANNULATED HIP PINNING;  Surgeon: Juanell Fairly, MD;  Location: [facility] ORS;  Service: Orthopedics;  Laterality: Right;  . KNEE SURGERY    . Open reduction internal fixation of the left olecranon fracture Left   . POSTERIOR LUMBAR SPINE FUSION ONE LEVEL      Prior to Admission medications   Medication Sig Start Date End Date Taking? Authorizing Provider  acetaminophen (TYLENOL) 325 MG tablet Take 2 tablets (650 mg total) by mouth every 6 (six) hours as needed for mild pain (or Fever >/= 101). 03/25/17   Altamese Dilling, MD  citalopram (CELEXA) 20 MG tablet Take 1 tablet (20 mg total) by mouth daily. 03/25/17   Altamese Dilling, MD  docusate sodium (COLACE) 100 MG capsule Take 1 capsule (100 mg total) by mouth 2 (two) times daily. 03/25/17   Altamese Dilling, MD  enoxaparin (LOVENOX) 40 MG/0.4ML injection Inject 0.4 mLs (40 mg total) into the skin  daily. 03/26/17   Altamese Dilling, MD  gabapentin (NEURONTIN) 600 MG tablet Take 0.5 tablets (300 mg total) by mouth 2 (two) times daily. 03/25/17   Altamese Dilling, MD  HYDROcodone-acetaminophen (NORCO/VICODIN) 5-325 MG tablet Take 1 tablet by mouth every 6 (six) hours as needed for moderate pain. 03/25/17   Altamese Dilling, MD  memantine (NAMENDA) 10 MG tablet Take 10 mg by mouth 2 (two) times daily. 04/15/15   [provider]  polyethylene glycol (MIRALAX / GLYCOLAX) packet Take 17 g by mouth daily as needed for mild constipation. 03/25/17   Altamese Dilling, MD  potassium chloride SA (K-DUR,KLOR-CON) 20 MEQ tablet Take 1 tablet (20 mEq total) by mouth daily. 03/25/17   Altamese Dilling, MD  senna (SENOKOT) 8.6 MG TABS tablet Take 1 tablet (8.6 mg total) by mouth 2 (two) times daily. 03/25/17   Altamese Dilling, MD    Allergies Amoxicillin and Penicillin g  Family History  Problem Relation Age of Onset  . Dementia Mother   . Stroke Father     Social History Social History   Tobacco Use  . Smoking status: Never Smoker  . Smokeless tobacco: Never Used  Substance Use Topics  . Alcohol use: No  . Drug use: No    Review of Systems  Constitutional: No fever/chills Eyes: No visual changes. ENT: No sore throat. Cardiovascular: Denies chest pain. Respiratory: Denies shortness of breath. Gastrointestinal: No abdominal pain.  No nausea, no vomiting.  No diarrhea.  No constipation. Genitourinary: Negative for dysuria. Musculoskeletal: Negative for back pain.  Positive for left hip pain. Skin: Negative for rash. Neurological: Negative for headaches, focal weakness or numbness.  ____________________________________________   PHYSICAL EXAM:  VITAL SIGNS: ED Triage Vitals  Enc Vitals Group     BP      Pulse      Resp      Temp      Temp src      SpO2      Weight      Height      Head Circumference      Peak Flow      Pain Score        Pain Loc      Pain Edu?      Excl. in GC?    Constitutional: Alert and oriented. Well appearing and in no acute distress. Eyes: Conjunctivae are normal. Head: Atraumatic. Nose: No congestion/rhinnorhea. Mouth/Throat: Mucous membranes are moist.  Oropharynx non-erythematous. Neck: No stridor.   Cardiovascular: Normal rate, regular rhythm. Grossly normal heart sounds.  Good peripheral circulation. Respiratory: Normal respiratory effort.  No retractions. Lungs CTAB. Gastrointestinal: Soft and nontender. No distention. No abdominal bruits. No CVA tenderness. Musculoskeletal: Shortening and rotation of the left hip is noted.  Patient has full range of motion's of the upper extremities and the right lower extremity. Neurologic:  Normal speech and language. No gross focal neurologic deficits are appreciated. No gait instability. Skin:  Skin is warm, dry and intact. No rash noted. Psychiatric: Mood and affect are normal. Speech and behavior are normal.  ____________________________________________   LABS (all labs ordered are listed, but only abnormal results are displayed)  Labs Reviewed  COMPREHENSIVE METABOLIC PANEL - Abnormal; Notable for the following components:      Result Value   Glucose, Bld 138 (*)    Calcium 8.8 (*)    All other components within normal limits  CBC WITH DIFFERENTIAL/PLATELET - Abnormal; Notable for the following components:   RBC 3.79 (*)    All other components within normal limits  APTT  URINALYSIS, COMPLETE (UACMP) WITH MICROSCOPIC   ____________________________________________  EKG   ____________________________________________  RADIOLOGY  ED MD interpretation: Comminuted intertrochanteric fracture of the proximal left femur is noted.  The femoral head remains situated in the acetabulum.  Chest x-ray is negative for acute cardiopulmonary or bony abnormality per radiology.  Official radiology report(s): Dg Chest 1 View  Result Date:  09/08/2017 CLINICAL DATA:  Fall with LEFT hip fracture. EXAM: CHEST  1 VIEW COMPARISON:  None FINDINGS: The cardiomediastinal silhouette is unremarkable. There is no evidence of focal airspace disease, pulmonary edema, suspicious pulmonary nodule/mass, pleural effusion, or pneumothorax. No acute bony abnormalities are identified. IMPRESSION: No active disease. Electronically Signed   By: Harmon PierJeffrey  Hu M.D.   On: 09/08/2017 21:41   Dg Hip Unilat With Pelvis 2-3 Views Left  Result Date: 09/08/2017 CLINICAL DATA:  Fall EXAM: DG HIP (WITH OR WITHOUT PELVIS) 2-3V LEFT COMPARISON:  04/29/2015 FINDINGS: There is a comminuted intertrochanteric fracture of the proximal left femur. The femoral head remains situated in the acetabulum. There are 3 cannulated lag screws traversing the right femoral neck without abnormal perihardware lucency. IMPRESSION: Comminuted intertrochanteric fracture of the left femur. Electronically Signed   By: Deatra RobinsonKevin  Herman M.D.   On: 09/08/2017 21:39    ____________________________________________   PROCEDURES  Procedure(s) performed: None  Procedures  Critical Care performed: No  ____________________________________________   INITIAL IMPRESSION / ASSESSMENT AND PLAN /  ED COURSE  As part of my medical decision making, I reviewed the following data within the electronic MEDICAL RECORD NUMBER Discussed with admitting physician Dr. Daphane Shepherd.   77 year old female presenting to the emergency department after mechanical, non-syncopal fall resulting in a left hip fracture.  Dr. Rosita Kea was made aware of the patient.  She will be admitted to medicine and he will see her in the morning.  Patient's pain was well controlled at last check with 50 mics of fentanyl and 4 mg of Zofran IV.    ____________________________________________   FINAL CLINICAL IMPRESSION(S) / ED DIAGNOSES  Final diagnoses:  Closed comminuted intertrochanteric fracture of left femur, initial encounter (HCC)  Fall,  initial encounter     ED Discharge Orders    None       Note:  This document was prepared using Dragon voice recognition software and may include unintentional dictation errors.    Chinita Pester, FNP 09/09/17 8119    Loleta Rose, MD 09/09/17 2258

## 2017-09-08 NOTE — ED Notes (Signed)
Pt assisted onto bedpan for urine specimen; pt unable to void at this time; pt assists to position self well

## 2017-09-08 NOTE — H&P (Signed)
[facility] - [place] at [facility]   PATIENT NAME: Katie Fitzgerald    MR#:  [id number]  DATE OF BIRTH:  1940-04-30  DATE OF ADMISSION:  09/08/2017  PRIMARY CARE PHYSICIAN: Patient, No Pcp Per   REQUESTING/REFERRING PHYSICIAN:   CHIEF COMPLAINT:   Chief Complaint  Patient presents with  . Fall    HISTORY OF PRESENT ILLNESS: Katie Fitzgerald  is a 77 y.o. female with a known history of dementia, hypertension and osteoarthritis. Patient is unable to provide history, due to dementia.  Most of the information was taken from reviewing the medical chart and from discussion with emergency room physician. She was brought to emergency room status post witnessed mechanical fall.  She complains of severe left hip pain, worse with range of motion at the left hip joint.  No reports of chest pain, head trauma or loss of consciousness. Patient had her right hip fractured, status post fall, just 6 months ago and underwent successful surgical repair. Blood test done emergency room, including CBC and CMP are grossly unremarkable. EKG shows normal sinus rhythm with heart rate at 72 and frequent PVCs; no acute ischemic changes. Left hip x-ray reveals comminuted intertrochanteric fracture of the left femur.  Patient is admitted for surgical repair of her femur fracture.  PAST MEDICAL HISTORY:   Past Medical History:  Diagnosis Date  . Dementia   . Hypertension   . Osteoarthrosis     PAST SURGICAL HISTORY:  Past Surgical History:  Procedure Laterality Date  . HIP PINNING,CANNULATED Right 03/23/2017   Procedure: CANNULATED HIP PINNING;  Surgeon: Juanell Fairly, MD;  Location: [facility] ORS;  Service: Orthopedics;  Laterality: Right;  . KNEE SURGERY    . Open reduction internal fixation of the left olecranon fracture Left   . POSTERIOR LUMBAR SPINE FUSION ONE LEVEL      SOCIAL HISTORY:  Social History   Tobacco Use  . Smoking status: Never Smoker  . Smokeless tobacco:  Never Used  Substance Use Topics  . Alcohol use: No    FAMILY HISTORY:  Family History  Problem Relation Age of Onset  . Dementia Mother   . Stroke Father     DRUG ALLERGIES:  Allergies  Allergen Reactions  . Amoxicillin     Other reaction(s): Unknown  . Penicillin G     Other reaction(s): Unknown    REVIEW OF SYSTEMS:   Unable to obtain due to patient's severe dementia.  MEDICATIONS AT HOME:  Prior to Admission medications   Medication Sig Start Date End Date Taking? Authorizing Provider  acetaminophen (TYLENOL) 325 MG tablet Take 2 tablets (650 mg total) by mouth every 6 (six) hours as needed for mild pain (or Fever >/= 101). 03/25/17   Altamese Dilling, MD  citalopram (CELEXA) 20 MG tablet Take 1 tablet (20 mg total) by mouth daily. 03/25/17   Altamese Dilling, MD  docusate sodium (COLACE) 100 MG capsule Take 1 capsule (100 mg total) by mouth 2 (two) times daily. 03/25/17   Altamese Dilling, MD  enoxaparin (LOVENOX) 40 MG/0.4ML injection Inject 0.4 mLs (40 mg total) into the skin daily. 03/26/17   Altamese Dilling, MD  gabapentin (NEURONTIN) 600 MG tablet Take 0.5 tablets (300 mg total) by mouth 2 (two) times daily. 03/25/17   Altamese Dilling, MD  HYDROcodone-acetaminophen (NORCO/VICODIN) 5-325 MG tablet Take 1 tablet by mouth every 6 (six) hours as needed for moderate pain. 03/25/17   Altamese Dilling, MD  memantine (NAMENDA) 10 MG tablet Take 10  mg by mouth 2 (two) times daily. 04/15/15   [provider]  polyethylene glycol (MIRALAX / GLYCOLAX) packet Take 17 g by mouth daily as needed for mild constipation. 03/25/17   Altamese DillingVachhani, Vaibhavkumar, MD  potassium chloride SA (K-DUR,KLOR-CON) 20 MEQ tablet Take 1 tablet (20 mEq total) by mouth daily. 03/25/17   Altamese DillingVachhani, Vaibhavkumar, MD  senna (SENOKOT) 8.6 MG TABS tablet Take 1 tablet (8.6 mg total) by mouth 2 (two) times daily. 03/25/17   Altamese DillingVachhani, Vaibhavkumar, MD      PHYSICAL  EXAMINATION:   VITAL SIGNS: Blood pressure (!) 145/71, pulse 69, temperature 98.7 F (37.1 C), temperature source Oral, resp. rate 16, height 5\' 6"  (1.676 m), weight 49.1 kg (108 lb 3.2 oz), SpO2 98 %.  GENERAL:  77 y.o.-year-old patient lying in the bed with moderate distress, secondary to left hip pain.  EYES: Pupils equal, round, reactive to light and accommodation. No scleral icterus.  HEENT: Head atraumatic, normocephalic. Oropharynx and nasopharynx clear.  NECK:  Supple, no jugular venous distention. No thyroid enlargement, no tenderness.  LUNGS: Normal breath sounds bilaterally, no wheezing, rales,rhonchi or crepitation. No use of accessory muscles of respiration.  CARDIOVASCULAR: S1, S2 normal. No S3/S4.  ABDOMEN: Soft, nontender, nondistended. Bowel sounds present. No organomegaly or mass.  EXTREMITIES: No pedal edema, cyanosis, or clubbing.  NEUROLOGIC: No focal weakness. PSYCHIATRIC: The patient is alert, but pleasantly confused secondary to dementia.  SKIN: No obvious rash, lesion, or ulcer.   LABORATORY PANEL:   CBC Recent Labs  Lab 09/08/17 2051  WBC 8.7  HGB 12.0  HCT 36.0  PLT 315  MCV 95.1  MCH 31.6  MCHC 33.2  RDW 14.0  LYMPHSABS 2.5  MONOABS 0.6  EOSABS 0.2  BASOSABS 0.1   ------------------------------------------------------------------------------------------------------------------  Chemistries  Recent Labs  Lab 09/08/17 2051  NA 141  K 3.7  CL 107  CO2 25  GLUCOSE 138*  BUN 20  CREATININE 0.72  CALCIUM 8.8*  AST 20  ALT 17  ALKPHOS 68  BILITOT 0.6   ------------------------------------------------------------------------------------------------------------------ estimated creatinine clearance is 47.1 mL/min (by C-G formula based on SCr of 0.72 mg/dL). ------------------------------------------------------------------------------------------------------------------ No results for input(s): TSH, T4TOTAL, T3FREE, THYROIDAB in the last 72  hours.  Invalid input(s): FREET3   Coagulation profile No results for input(s): INR, PROTIME in the last 168 hours. ------------------------------------------------------------------------------------------------------------------- No results for input(s): DDIMER in the last 72 hours. -------------------------------------------------------------------------------------------------------------------  Cardiac Enzymes No results for input(s): CKMB, TROPONINI, MYOGLOBIN in the last 168 hours.  Invalid input(s): CK ------------------------------------------------------------------------------------------------------------------ Invalid input(s): POCBNP  ---------------------------------------------------------------------------------------------------------------  Urinalysis    Component Value Date/Time   COLORURINE YELLOW (A) 05/10/2015 0910   APPEARANCEUR CLOUDY (A) 05/10/2015 0910   LABSPEC 1.014 05/10/2015 0910   PHURINE 8.0 05/10/2015 0910   GLUCOSEU NEGATIVE 05/10/2015 0910   HGBUR 1+ (A) 05/10/2015 0910   BILIRUBINUR NEGATIVE 05/10/2015 0910   KETONESUR NEGATIVE 05/10/2015 0910   PROTEINUR NEGATIVE 05/10/2015 0910   NITRITE NEGATIVE 05/10/2015 0910   LEUKOCYTESUR NEGATIVE 05/10/2015 0910     RADIOLOGY: Dg Chest 1 View  Result Date: 09/08/2017 CLINICAL DATA:  Fall with LEFT hip fracture. EXAM: CHEST  1 VIEW COMPARISON:  None FINDINGS: The cardiomediastinal silhouette is unremarkable. There is no evidence of focal airspace disease, pulmonary edema, suspicious pulmonary nodule/mass, pleural effusion, or pneumothorax. No acute bony abnormalities are identified. IMPRESSION: No active disease. Electronically Signed   By: Harmon PierJeffrey  Hu M.D.   On: 09/08/2017 21:41   Dg Hip Unilat With Pelvis 2-3 Views Left  Result Date: 09/08/2017 CLINICAL DATA:  Fall EXAM: DG HIP (WITH OR WITHOUT PELVIS) 2-3V LEFT COMPARISON:  04/29/2015 FINDINGS: There is a comminuted intertrochanteric fracture of  the proximal left femur. The femoral head remains situated in the acetabulum. There are 3 cannulated lag screws traversing the right femoral neck without abnormal perihardware lucency. IMPRESSION: Comminuted intertrochanteric fracture of the left femur. Electronically Signed   By: Deatra Robinson M.D.   On: 09/08/2017 21:39    EKG: Orders placed or performed during the hospital encounter of 04/29/15  . ED EKG  . ED EKG  . EKG 12-Lead  . EKG 12-Lead    IMPRESSION AND PLAN:  1. Comminuted intertrochanteric fracture of the left femur.  Patient is admitted for possible surgical repair in a.m. We will hold any anticoagulants and keep patient n.p.o. continue supportive measures with pain control and IV fluids. Based on the review of patient's physical capacity, medical problems and EKG report, she is medically clear for the above mentioned orthopedic procedure.  Patient is a low risk for postop cardiopulmonary complications. 2.  Dementia, continue Namenda.  All the records are reviewed and case discussed with ED provider. Management plans discussed with the patient, family and they are in agreement.  CODE STATUS: Full Code Status History    Date Active Date Inactive Code Status Order ID Comments User Context   03/22/2017 2233 03/25/2017 1531 Full Code [id number]  Bertrum Sol, MD Inpatient   04/29/2015 1551 05/01/2015 1921 Full Code [id number]  Shaune Pollack, MD Inpatient       TOTAL TIME TAKING CARE OF THIS PATIENT: 45 minutes.    Cammy Copa M.D on 09/08/2017 at 11:08 PM  Between 7am to 6pm - Pager - [phone]  After 6pm go to [email or web address] - password [email or web address]  [facility]  Office  [phone]  CC: Primary care physician; Patient, No Pcp Per

## 2017-09-09 ENCOUNTER — Encounter
Admission: EM | Disposition: A | Discharge status: Skilled Nursing Facility | Payer: Self-pay | Source: Home / Self Care | Attending: Internal Medicine

## 2017-09-09 ENCOUNTER — Encounter: Payer: Self-pay | Admitting: *Deleted

## 2017-09-09 ENCOUNTER — Inpatient Hospital Stay: Payer: Medicare Other

## 2017-09-09 ENCOUNTER — Inpatient Hospital Stay: Payer: Medicare Other | Admitting: Anesthesiology

## 2017-09-09 DIAGNOSIS — Z01810 Encounter for preprocedural cardiovascular examination: Secondary | ICD-10-CM

## 2017-09-09 HISTORY — PX: INTRAMEDULLARY (IM) NAIL INTERTROCHANTERIC: SHX5875

## 2017-09-09 LAB — BASIC METABOLIC PANEL
ANION GAP: 7 (ref 5–15)
BUN: 22 mg/dL (ref 8–23)
CALCIUM: 9.1 mg/dL (ref 8.9–10.3)
CO2: 28 mmol/L (ref 22–32)
CREATININE: 0.86 mg/dL (ref 0.44–1.00)
Chloride: 107 mmol/L (ref 98–111)
Glucose, Bld: 114 mg/dL — ABNORMAL HIGH (ref 70–99)
Potassium: 3.8 mmol/L (ref 3.5–5.1)
SODIUM: 142 mmol/L (ref 135–145)

## 2017-09-09 LAB — SURGICAL PCR SCREEN
MRSA, PCR: NEGATIVE
STAPHYLOCOCCUS AUREUS: NEGATIVE

## 2017-09-09 LAB — CBC
HCT: 32 % — ABNORMAL LOW (ref 35.0–47.0)
Hemoglobin: 10.8 g/dL — ABNORMAL LOW (ref 12.0–16.0)
MCH: 31.9 pg (ref 26.0–34.0)
MCHC: 33.7 g/dL (ref 32.0–36.0)
MCV: 94.7 fL (ref 80.0–100.0)
PLATELETS: 307 10*3/uL (ref 150–440)
RBC: 3.38 MIL/uL — AB (ref 3.80–5.20)
RDW: 13.8 % (ref 11.5–14.5)
WBC: 11.6 10*3/uL — AB (ref 3.6–11.0)

## 2017-09-09 LAB — GLUCOSE, CAPILLARY: GLUCOSE-CAPILLARY: 89 mg/dL (ref 70–99)

## 2017-09-09 SURGERY — FIXATION, FRACTURE, INTERTROCHANTERIC, WITH INTRAMEDULLARY ROD
Anesthesia: General | Site: Hip | Laterality: Left | Wound class: Clean

## 2017-09-09 MED ORDER — DOCUSATE SODIUM 100 MG PO CAPS
100.0000 mg | ORAL_CAPSULE | Freq: Two times a day (BID) | ORAL | Status: DC
  Administered 2017-09-09 – 2017-09-11 (×4): 100 mg via ORAL
  Filled 2017-09-09 (×4): qty 1

## 2017-09-09 MED ORDER — METOCLOPRAMIDE HCL 5 MG/ML IJ SOLN: 5.0000 mg | Freq: Three times a day (TID) | INTRAMUSCULAR | Status: DC | PRN

## 2017-09-09 MED ORDER — DOCUSATE SODIUM 100 MG PO CAPS
100.0000 mg | ORAL_CAPSULE | Freq: Two times a day (BID) | ORAL | Status: DC
  Administered 2017-09-09: 100 mg via ORAL
  Filled 2017-09-09: qty 1

## 2017-09-09 MED ORDER — MEMANTINE HCL 5 MG PO TABS
10.0000 mg | ORAL_TABLET | Freq: Two times a day (BID) | ORAL | Status: DC
  Administered 2017-09-09 – 2017-09-11 (×5): 10 mg via ORAL
  Filled 2017-09-09 (×5): qty 2

## 2017-09-09 MED ORDER — SODIUM CHLORIDE 0.9 % IV SOLN
INTRAVENOUS | Status: DC
  Administered 2017-09-09: 16:00:00 via INTRAVENOUS
  Administered 2017-09-10: 75 mL/h via INTRAVENOUS

## 2017-09-09 MED ORDER — ACETAMINOPHEN 10 MG/ML IV SOLN
INTRAVENOUS | Status: DC | PRN
  Administered 2017-09-09: 1000 mg via INTRAVENOUS

## 2017-09-09 MED ORDER — SUGAMMADEX SODIUM 200 MG/2ML IV SOLN
INTRAVENOUS | Status: AC
Start: 2017-09-09 — End: ?
  Filled 2017-09-09: qty 2

## 2017-09-09 MED ORDER — PROPOFOL 10 MG/ML IV BOLUS
INTRAVENOUS | Status: DC | PRN
  Administered 2017-09-09: 100 mg via INTRAVENOUS

## 2017-09-09 MED ORDER — FENTANYL CITRATE (PF) 100 MCG/2ML IJ SOLN
25.0000 ug | INTRAMUSCULAR | Status: DC | PRN
  Administered 2017-09-09: 25 ug via INTRAVENOUS

## 2017-09-09 MED ORDER — MAGNESIUM CITRATE PO SOLN
1.0000 | Freq: Once | ORAL | Status: DC | PRN
  Filled 2017-09-09: qty 296

## 2017-09-09 MED ORDER — FENTANYL CITRATE (PF) 100 MCG/2ML IJ SOLN
INTRAMUSCULAR | Status: AC
  Filled 2017-09-09: qty 2

## 2017-09-09 MED ORDER — ONDANSETRON HCL 4 MG/2ML IJ SOLN: 4.0000 mg | Freq: Four times a day (QID) | INTRAMUSCULAR | Status: DC | PRN

## 2017-09-09 MED ORDER — ONDANSETRON HCL 4 MG PO TABS: 4.0000 mg | ORAL_TABLET | Freq: Four times a day (QID) | ORAL | Status: DC | PRN

## 2017-09-09 MED ORDER — ACETAMINOPHEN 500 MG PO TABS
500.0000 mg | ORAL_TABLET | Freq: Four times a day (QID) | ORAL | Status: AC
  Administered 2017-09-10: 500 mg via ORAL
  Filled 2017-09-09: qty 1

## 2017-09-09 MED ORDER — SODIUM CHLORIDE 0.9 % IV SOLN
INTRAVENOUS | Status: DC
  Administered 2017-09-09: 75 mL/h via INTRAVENOUS

## 2017-09-09 MED ORDER — MORPHINE SULFATE (PF) 2 MG/ML IV SOLN
0.5000 mg | INTRAVENOUS | Status: DC | PRN
Start: 2017-09-09 — End: 2017-09-11
  Administered 2017-09-09: 1 mg via INTRAVENOUS
  Filled 2017-09-09: qty 1

## 2017-09-09 MED ORDER — ACETAMINOPHEN 650 MG RE SUPP: 650.0000 mg | Freq: Four times a day (QID) | RECTAL | Status: DC | PRN

## 2017-09-09 MED ORDER — BISACODYL 5 MG PO TBEC
5.0000 mg | DELAYED_RELEASE_TABLET | Freq: Every day | ORAL | Status: DC | PRN
  Administered 2017-09-10: 5 mg via ORAL
  Filled 2017-09-09: qty 1

## 2017-09-09 MED ORDER — ACETAMINOPHEN 325 MG PO TABS: 325.0000 mg | ORAL_TABLET | Freq: Four times a day (QID) | ORAL | Status: DC | PRN

## 2017-09-09 MED ORDER — CLINDAMYCIN PHOSPHATE 600 MG/50ML IV SOLN
600.0000 mg | Freq: Four times a day (QID) | INTRAVENOUS | Status: AC
  Administered 2017-09-09 – 2017-09-10 (×2): 600 mg via INTRAVENOUS
  Filled 2017-09-09 (×3): qty 50

## 2017-09-09 MED ORDER — ONDANSETRON HCL 4 MG/2ML IJ SOLN
INTRAMUSCULAR | Status: AC
Start: 2017-09-09 — End: ?
  Filled 2017-09-09: qty 2

## 2017-09-09 MED ORDER — SUGAMMADEX SODIUM 200 MG/2ML IV SOLN
INTRAVENOUS | Status: DC | PRN
  Administered 2017-09-09: 100 mg via INTRAVENOUS

## 2017-09-09 MED ORDER — ZOLPIDEM TARTRATE 5 MG PO TABS: 5.0000 mg | ORAL_TABLET | Freq: Every evening | ORAL | Status: DC | PRN

## 2017-09-09 MED ORDER — SODIUM CHLORIDE 0.9 % IV SOLN
INTRAVENOUS | Status: DC | PRN
  Administered 2017-09-09: 50 ug/min via INTRAVENOUS

## 2017-09-09 MED ORDER — PROPOFOL 500 MG/50ML IV EMUL
INTRAVENOUS | Status: AC
  Filled 2017-09-09: qty 50

## 2017-09-09 MED ORDER — ACETAMINOPHEN 10 MG/ML IV SOLN
INTRAVENOUS | Status: AC
  Filled 2017-09-09: qty 100

## 2017-09-09 MED ORDER — HYDROCODONE-ACETAMINOPHEN 5-325 MG PO TABS
1.0000 | ORAL_TABLET | ORAL | Status: DC | PRN
  Administered 2017-09-09 – 2017-09-10 (×3): 1 via ORAL
  Filled 2017-09-09 (×3): qty 1

## 2017-09-09 MED ORDER — ONDANSETRON HCL 4 MG/2ML IJ SOLN: 4.0000 mg | Freq: Once | INTRAMUSCULAR | Status: DC | PRN

## 2017-09-09 MED ORDER — DEXAMETHASONE SODIUM PHOSPHATE 10 MG/ML IJ SOLN
INTRAMUSCULAR | Status: DC | PRN
  Administered 2017-09-09: 5 mg via INTRAVENOUS

## 2017-09-09 MED ORDER — MIDAZOLAM HCL 2 MG/2ML IJ SOLN
INTRAMUSCULAR | Status: DC | PRN
  Administered 2017-09-09: 1 mg via INTRAVENOUS

## 2017-09-09 MED ORDER — MENTHOL 3 MG MT LOZG
1.0000 | LOZENGE | OROMUCOSAL | Status: DC | PRN
  Filled 2017-09-09: qty 9

## 2017-09-09 MED ORDER — LIDOCAINE HCL (CARDIAC) PF 100 MG/5ML IV SOSY
PREFILLED_SYRINGE | INTRAVENOUS | Status: DC | PRN
  Administered 2017-09-09: 50 mg via INTRAVENOUS

## 2017-09-09 MED ORDER — GABAPENTIN 300 MG PO CAPS
300.0000 mg | ORAL_CAPSULE | Freq: Two times a day (BID) | ORAL | Status: DC
  Administered 2017-09-09 – 2017-09-11 (×5): 300 mg via ORAL
  Filled 2017-09-09 (×5): qty 1

## 2017-09-09 MED ORDER — PHENOL 1.4 % MT LIQD
1.0000 | OROMUCOSAL | Status: DC | PRN
  Filled 2017-09-09: qty 177

## 2017-09-09 MED ORDER — LIDOCAINE HCL (PF) 2 % IJ SOLN
INTRAMUSCULAR | Status: AC
Start: 2017-09-09 — End: ?
  Filled 2017-09-09: qty 10

## 2017-09-09 MED ORDER — POTASSIUM CHLORIDE CRYS ER 20 MEQ PO TBCR
20.0000 meq | EXTENDED_RELEASE_TABLET | Freq: Every day | ORAL | Status: DC
  Administered 2017-09-10 – 2017-09-11 (×2): 20 meq via ORAL
  Filled 2017-09-09 (×2): qty 1

## 2017-09-09 MED ORDER — MAGNESIUM HYDROXIDE 400 MG/5ML PO SUSP
30.0000 mL | Freq: Every day | ORAL | Status: DC | PRN
  Filled 2017-09-09: qty 30

## 2017-09-09 MED ORDER — CITALOPRAM HYDROBROMIDE 20 MG PO TABS
20.0000 mg | ORAL_TABLET | Freq: Every day | ORAL | Status: DC
  Administered 2017-09-10 – 2017-09-11 (×2): 20 mg via ORAL
  Filled 2017-09-09 (×2): qty 1

## 2017-09-09 MED ORDER — METOCLOPRAMIDE HCL 10 MG PO TABS: 5.0000 mg | ORAL_TABLET | Freq: Three times a day (TID) | ORAL | Status: DC | PRN

## 2017-09-09 MED ORDER — FENTANYL CITRATE (PF) 100 MCG/2ML IJ SOLN
INTRAMUSCULAR | Status: AC
  Administered 2017-09-09: 25 ug via INTRAVENOUS
  Filled 2017-09-09: qty 2

## 2017-09-09 MED ORDER — TRAZODONE HCL 50 MG PO TABS
25.0000 mg | ORAL_TABLET | Freq: Every evening | ORAL | Status: DC | PRN
  Administered 2017-09-09 – 2017-09-10 (×3): 25 mg via ORAL
  Filled 2017-09-09 (×3): qty 1

## 2017-09-09 MED ORDER — ALUM & MAG HYDROXIDE-SIMETH 200-200-20 MG/5ML PO SUSP: 30.0000 mL | ORAL | Status: DC | PRN

## 2017-09-09 MED ORDER — ROCURONIUM BROMIDE 100 MG/10ML IV SOLN
INTRAVENOUS | Status: DC | PRN
  Administered 2017-09-09: 30 mg via INTRAVENOUS

## 2017-09-09 MED ORDER — HYDROCODONE-ACETAMINOPHEN 5-325 MG PO TABS
1.0000 | ORAL_TABLET | ORAL | Status: DC | PRN
Start: 2017-09-09 — End: 2017-09-09

## 2017-09-09 MED ORDER — PHENYLEPHRINE HCL 10 MG/ML IJ SOLN
INTRAMUSCULAR | Status: DC | PRN
  Administered 2017-09-09 (×3): 100 ug via INTRAVENOUS
  Administered 2017-09-09: 200 ug via INTRAVENOUS

## 2017-09-09 MED ORDER — ACETAMINOPHEN 325 MG PO TABS
650.0000 mg | ORAL_TABLET | Freq: Four times a day (QID) | ORAL | Status: DC | PRN
  Administered 2017-09-11: 650 mg via ORAL
  Filled 2017-09-09 (×2): qty 2

## 2017-09-09 MED ORDER — DEXAMETHASONE SODIUM PHOSPHATE 10 MG/ML IJ SOLN
INTRAMUSCULAR | Status: AC
  Filled 2017-09-09: qty 1

## 2017-09-09 MED ORDER — METHOCARBAMOL 1000 MG/10ML IJ SOLN
500.0000 mg | Freq: Four times a day (QID) | INTRAVENOUS | Status: DC | PRN
  Filled 2017-09-09: qty 5

## 2017-09-09 MED ORDER — SODIUM CHLORIDE 0.9 % IV BOLUS
500.0000 mL | Freq: Once | INTRAVENOUS | Status: AC
  Administered 2017-09-09: 500 mL via INTRAVENOUS

## 2017-09-09 MED ORDER — MIDAZOLAM HCL 2 MG/2ML IJ SOLN
INTRAMUSCULAR | Status: AC
  Filled 2017-09-09: qty 2

## 2017-09-09 MED ORDER — METHOCARBAMOL 500 MG PO TABS
500.0000 mg | ORAL_TABLET | Freq: Four times a day (QID) | ORAL | Status: DC | PRN
  Administered 2017-09-10 (×2): 500 mg via ORAL
  Filled 2017-09-09 (×2): qty 1

## 2017-09-09 MED ORDER — FENTANYL CITRATE (PF) 100 MCG/2ML IJ SOLN
INTRAMUSCULAR | Status: DC | PRN
  Administered 2017-09-09 (×2): 50 ug via INTRAVENOUS

## 2017-09-09 MED ORDER — ROCURONIUM BROMIDE 50 MG/5ML IV SOLN
INTRAVENOUS | Status: AC
  Filled 2017-09-09: qty 1

## 2017-09-09 MED ORDER — ENOXAPARIN SODIUM 40 MG/0.4ML ~~LOC~~ SOLN
40.0000 mg | SUBCUTANEOUS | Status: DC
  Administered 2017-09-10 – 2017-09-11 (×2): 40 mg via SUBCUTANEOUS
  Filled 2017-09-09 (×2): qty 0.4

## 2017-09-09 MED ORDER — BISACODYL 10 MG RE SUPP: 10.0000 mg | Freq: Every day | RECTAL | Status: DC | PRN

## 2017-09-09 MED ORDER — ONDANSETRON HCL 4 MG/2ML IJ SOLN
INTRAMUSCULAR | Status: DC | PRN
  Administered 2017-09-09: 4 mg via INTRAVENOUS

## 2017-09-09 SURGICAL SUPPLY — 32 items
BIT DRILL 4.3MMS DISTAL GRDTED (BIT) ×1 IMPLANT
CANISTER SUCT 1200ML W/VALVE (MISCELLANEOUS) ×3 IMPLANT
CHLORAPREP W/TINT 26ML (MISCELLANEOUS) ×3 IMPLANT
DRAPE SHEET LG 3/4 BI-LAMINATE (DRAPES) ×3 IMPLANT
DRAPE U-SHAPE 47X51 STRL (DRAPES) ×3 IMPLANT
DRILL 4.3MMS DISTAL GRADUATED (BIT) ×3
DRSG OPSITE POSTOP 3X4 (GAUZE/BANDAGES/DRESSINGS) ×9 IMPLANT
GLOVE BIOGEL PI IND STRL 9 (GLOVE) ×1 IMPLANT
GLOVE BIOGEL PI INDICATOR 9 (GLOVE) ×2
GLOVE SURG SYN 9.0  PF PI (GLOVE) ×2
GLOVE SURG SYN 9.0 PF PI (GLOVE) ×1 IMPLANT
GOWN SRG 2XL LVL 4 RGLN SLV (GOWNS) ×1 IMPLANT
GOWN STRL NON-REIN 2XL LVL4 (GOWNS) ×2
GOWN STRL REUS W/ TWL LRG LVL3 (GOWN DISPOSABLE) ×1 IMPLANT
GOWN STRL REUS W/TWL LRG LVL3 (GOWN DISPOSABLE) ×2
GUIDEPIN VERSANAIL DSP 3.2X444 (ORTHOPEDIC DISPOSABLE SUPPLIES) ×3 IMPLANT
GUIDEWIRE BALL NOSE 100CM (WIRE) ×3 IMPLANT
HIP FRAC NAIL LAG SCR 10.5X100 (Orthopedic Implant) ×2 IMPLANT
KIT TURNOVER KIT A (KITS) ×3 IMPLANT
MAT BLUE FLOOR 46X72 FLO (MISCELLANEOUS) ×3 IMPLANT
NAIL HIP FRA AFFIX 130X9X340 L (Nail) ×3 IMPLANT
NEEDLE FILTER BLUNT 18X 1/2SAF (NEEDLE) ×2
NEEDLE FILTER BLUNT 18X1 1/2 (NEEDLE) ×1 IMPLANT
NS IRRIG 500ML POUR BTL (IV SOLUTION) ×3 IMPLANT
PACK HIP COMPR (MISCELLANEOUS) ×3 IMPLANT
SCALPEL PROTECTED #15 DISP (BLADE) ×6 IMPLANT
SCREW BONE CORTICAL 5.0X44 (Screw) ×3 IMPLANT
SCREW CANN THRD AFF 10.5X100 (Orthopedic Implant) ×1 IMPLANT
STAPLER SKIN PROX 35W (STAPLE) ×3 IMPLANT
SUT VIC AB 1 CT1 36 (SUTURE) ×3 IMPLANT
SUT VIC AB 2-0 CT1 (SUTURE) ×3 IMPLANT
SYR 10ML LL (SYRINGE) ×3 IMPLANT

## 2017-09-09 NOTE — Transfer of Care (Signed)
Immediate Anesthesia Transfer of Care Note  Patient: Katie MainsDianne V Meints  Procedure(s) Performed: INTRAMEDULLARY (IM) NAIL INTERTROCHANTRIC (Left Hip)  Patient Location: PACU  Anesthesia Type:Spinal  Level of Consciousness: sedated  Airway & Oxygen Therapy: Patient connected to face mask oxygen  Post-op Assessment: Post -op Vital signs reviewed and stable  Post vital signs: stable  Last Vitals:  Vitals Value Taken Time  BP 141/63 09/09/2017 12:59 PM  Temp    Pulse 78 09/09/2017  1:00 PM  Resp 10 09/09/2017  1:00 PM  SpO2 100 % 09/09/2017  1:00 PM  Vitals shown include unvalidated device data.  Last Pain:  Vitals:   09/09/17 0753  TempSrc: Oral         Complications: No apparent anesthesia complications

## 2017-09-09 NOTE — Consult Note (Addendum)
Reason for consult: Left intertrochanteric hip fracture  History: Patient is a 77 year old who suffered a fall at home while getting out of bed.  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  She suffered a prior right hip fracture earlier this year treated successfully with pinning by Dr. Martha ClanKrasinski.  She was brought to the emergency room where she is found to have an intertrochanteric hip fracture and is admitted for the treatment of this.  She is a Engineer, technical saleshousehold ambulator.  On exam her left leg is shortened and externally rotated.  She is able to flex extend the toes and has palpable pulses.  There is no ecchymosis and skin is intact.  She is in a great deal of pain.  Radiographs: X-rays show a slightly comminuted intertrochanteric fracture without significant osteoarthritis.  Impression is left intertrochanteric hip fracture  Recommendation is for ORIF with intramedullary device later today.

## 2017-09-09 NOTE — Progress Notes (Signed)
Two RN phone verification of consent completed with patients son in law Olen Cordial(Joey Roy).

## 2017-09-09 NOTE — Anesthesia Post-op Follow-up Note (Signed)
Anesthesia QCDR form completed.        

## 2017-09-09 NOTE — Op Note (Signed)
09/09/2017  12:53 PM  PATIENT:  Katie Fitzgerald  77 y.o. female  PRE-OPERATIVE DIAGNOSIS:  HIP FRACTUE, LEFT intertrochanteric  POST-OPERATIVE DIAGNOSIS:  left hip fracture  PROCEDURE:  Procedure(s): INTRAMEDULLARY (IM) NAIL INTERTROCHANTRIC (Left)  SURGEON: Leitha SchullerMichael J Zareen Jamison, MD  ASSISTANTS: None  ANESTHESIA:   general  EBL:  Total I/O In: 525 [I.V.:525] Out: 650 [Urine:450; Blood:200]  BLOOD ADMINISTERED:none  DRAINS: none   LOCAL MEDICATIONS USED:  NONE  SPECIMEN:  No Specimen  DISPOSITION OF SPECIMEN:  N/A  COUNTS:  YES  TOURNIQUET:  * No tourniquets in log *  IMPLANTS: Biomet affixes left 11 x 340 with 100 mm leg screw 44 mm interlocking screw  DICTATION: .Dragon Dictation brought to the operating room and after adequate general anesthesia was obtained, patient was transferred to the fracture table where the left leg was placed in traction boot, right leg and leg holder.  C arm was brought in with traction applied anatomic alignment was obtained.  The hip was prepped and draped in the usual sterile fashion using a barrier drape method.  After patient identification and timeout procedures were completed a small incision was made proximal greater trochanter.  Guidewire was inserted into the trochanter proximal reaming was carried out for rod length.  Next reaming was carried out 13 mm and 11 x 340 rod inserted without difficulty to the appropriate depth.  A small lateral incision was made and a guidewire inserted inserted into the center center position into the head.  Measurement was made off of this drilling carried out the 100 mm leg screw inserted.  Next the traction was released on the leg and the compression device utilized.  The set screw was then tightened proximally and according to allow for further compression.  The insertion handle was removed and permanent nuclear images proximally was obtained.  Going distally with a leg abducted distal interlocking screw was  placed using standard technique drilling measuring and placing a 5.0 cortical screw.  A permanent C arm view from the AP view was obtained.  Wounds were thoroughly irrigated the deep fascia repaired using 0 Vicryl 2-0 Vicryl subcutaneously and skin staples followed by Xeroform and honeycomb dressing.  PLAN OF CARE: Continue as inpatient  PATIENT DISPOSITION:  PACU - hemodynamically stable.

## 2017-09-09 NOTE — Progress Notes (Signed)
Pt BP improved after 500 cc bolus to 108/48. Pt is more talkative but still drowsy. Will continue to monitor.

## 2017-09-09 NOTE — Anesthesia Postprocedure Evaluation (Signed)
Anesthesia Post Note  Patient: Katie Fitzgerald  Procedure(s) Performed: INTRAMEDULLARY (IM) NAIL INTERTROCHANTRIC (Left Hip)  Patient location during evaluation: PACU Anesthesia Type: General Level of consciousness: awake and alert Pain management: pain level controlled Vital Signs Assessment: post-procedure vital signs reviewed and stable Respiratory status: spontaneous breathing and respiratory function stable Cardiovascular status: stable Anesthetic complications: no     Last Vitals:  Vitals:   09/09/17 1413 09/09/17 1525  BP: (!) 104/48 (!) 91/47  Pulse: 71 68  Resp: 16 18  Temp: 36.8 C 36.7 C  SpO2: 99% 97%    Last Pain:  Vitals:   09/09/17 1525  TempSrc: Oral                 Josean Lycan K

## 2017-09-09 NOTE — Anesthesia Procedure Notes (Signed)
Procedure Name: Intubation Date/Time: 09/09/2017 12:01 PM Performed by: Irving BurtonBachich, Aerionna Moravek, CRNA Pre-anesthesia Checklist: Patient identified, Emergency Drugs available, Suction available and Patient being monitored Patient Re-evaluated:Patient Re-evaluated prior to induction Oxygen Delivery Method: Circle system utilized Preoxygenation: Pre-oxygenation with 100% oxygen Induction Type: IV induction Ventilation: Mask ventilation without difficulty Laryngoscope Size: McGraph and 3 Grade View: Grade I Tube type: Oral Tube size: 7.0 mm Number of attempts: 2 Airway Equipment and Method: Stylet and Video-laryngoscopy Placement Confirmation: ETT inserted through vocal cords under direct vision,  positive ETCO2 and breath sounds checked- equal and bilateral Secured at: 21 cm Tube secured with: Tape Dental Injury: Teeth and Oropharynx as per pre-operative assessment  Difficulty Due To: Difficult Airway- due to anterior larynx

## 2017-09-09 NOTE — Progress Notes (Addendum)
Patient ID: Katie MainsDianne V Vilchis, female   DOB: 03/19/1940, 77 y.o.   MRN: [id number]  [facility] PROGRESS NOTE  Katie MainsDianne V Gutridge WUJ:[id number] DOB: 03/10/1940 DOA: 09/08/2017 PCP: Patient, No Pcp Per  HPI/Subjective: Patient states that she has memory loss.  Cannot recall what happened.  Caregiver mentioned that she was trying to sit on the chair but missed the chair and fell on her hip.  Patient complains of a lot of pain in the hip.  Patient does answer some yes or no questions.  Objective: Vitals:   09/08/17 2330 09/09/17 0028  BP: (!) 143/64 (!) 130/52  Pulse: 66 76  Resp:  16  Temp:  98.1 F (36.7 C)  SpO2: 97% 100%    Filed Weights   09/08/17 2158 09/09/17 0500  Weight: 49.1 kg (108 lb 3.2 oz) 49 kg (108 lb 0.4 oz)    ROS: Review of Systems  Unable to perform ROS: Dementia  Constitutional: Negative for fever.  Respiratory: Negative for cough and shortness of breath.   Cardiovascular: Negative for chest pain.  Gastrointestinal: Negative for abdominal pain.  Genitourinary: Negative for dysuria.  Musculoskeletal: Positive for joint pain.  Neurological: Negative for headaches.   Exam: Physical Exam  HENT:  Nose: No mucosal edema.  Mouth/Throat: No oropharyngeal exudate or posterior oropharyngeal edema.  Eyes: Pupils are equal, round, and reactive to light. Conjunctivae, EOM and lids are normal.  Neck: No JVD present. Carotid bruit is not present. No edema present. No thyroid mass and no thyromegaly present.  Cardiovascular: S1 normal and S2 normal. Exam reveals no gallop.  No murmur heard. Pulses:      Dorsalis pedis pulses are 2+ on the right side, and 2+ on the left side.  Respiratory: No respiratory distress. She has no wheezes. She has no rhonchi. She has no rales.  GI: Soft. Bowel sounds are normal. There is no tenderness.  Musculoskeletal:       Left ankle: She exhibits no swelling.  Lymphadenopathy:    She has no cervical adenopathy.  Neurological: She is  alert. No cranial nerve deficit.  Skin: Skin is warm. No rash noted. Nails show no clubbing.  Psychiatric: She has a normal mood and affect.      Data Reviewed: Basic Metabolic Panel: Recent Labs  Lab 09/08/17 2051 09/09/17 0422  NA 141 142  K 3.7 3.8  CL 107 107  CO2 25 28  GLUCOSE 138* 114*  BUN 20 22  CREATININE 0.72 0.86  CALCIUM 8.8* 9.1   Liver Function Tests: Recent Labs  Lab 09/08/17 2051  AST 20  ALT 17  ALKPHOS 68  BILITOT 0.6  PROT 6.5  ALBUMIN 3.7   CBC: Recent Labs  Lab 09/08/17 2051 09/09/17 0422  WBC 8.7 11.6*  NEUTROABS 5.4  --   HGB 12.0 10.8*  HCT 36.0 32.0*  MCV 95.1 94.7  PLT 315 307     Studies: Dg Chest 1 View  Result Date: 09/08/2017 CLINICAL DATA:  Fall with LEFT hip fracture. EXAM: CHEST  1 VIEW COMPARISON:  None FINDINGS: The cardiomediastinal silhouette is unremarkable. There is no evidence of focal airspace disease, pulmonary edema, suspicious pulmonary nodule/mass, pleural effusion, or pneumothorax. No acute bony abnormalities are identified. IMPRESSION: No active disease. Electronically Signed   By: Harmon PierJeffrey  Hu M.D.   On: 09/08/2017 21:41   Dg Hip Unilat With Pelvis 2-3 Views Left  Result Date: 09/08/2017 CLINICAL DATA:  Fall EXAM: DG HIP (WITH OR WITHOUT PELVIS) 2-3V LEFT  COMPARISON:  04/29/2015 FINDINGS: There is a comminuted intertrochanteric fracture of the proximal left femur. The femoral head remains situated in the acetabulum. There are 3 cannulated lag screws traversing the right femoral neck without abnormal perihardware lucency. IMPRESSION: Comminuted intertrochanteric fracture of the left femur. Electronically Signed   By: Deatra Robinson M.D.   On: 09/08/2017 21:39    Scheduled Meds: . citalopram  20 mg Oral Daily  . docusate sodium  100 mg Oral BID  . gabapentin  300 mg Oral BID  . memantine  10 mg Oral BID  . potassium chloride SA  20 mEq Oral Daily   Continuous Infusions: . sodium chloride 75 mL/hr (09/09/17  0030)  . clindamycin (CLEOCIN) IV      Assessment/Plan:  1. Preoperative consultation for left hip fracture.  No contraindications to surgery at this time.  Mortality high within the first year of hip fracture especially if they do not get up and walk. 2. Dementia on Namenda.  Patient has 24/7 care at home. 3. Neuropathy on gabapentin  4. depression on Celexa   Code Status:     Code Status Orders  (From admission, onward)        Start     Ordered   09/09/17 0025  Full code  Continuous     09/09/17 0024    Code Status History    Date Active Date Inactive Code Status Order ID Comments User Context   03/22/2017 2233 03/25/2017 1531 Full Code [id number]  Bertrum Sol, MD Inpatient   04/29/2015 1551 05/01/2015 1921 Full Code [id number]  Shaune Pollack, MD Inpatient     Family Communication:  spoke with 1 of the caregivers at the bedside Disposition Plan: Likely out to rehab postoperatively day 2 or 3  Consultants:  Orthopedic surgery  Time spent: 28 minutes  Saleha Kalp [employer]

## 2017-09-09 NOTE — Progress Notes (Signed)
Ice pack to left hip

## 2017-09-09 NOTE — Progress Notes (Signed)
Pt awakens to pain and mumbles but is drowsy. Will hold all PO medications and sedating medications until more awake. 500 ml fluid bolus administered for soft BP 91/47, will re-check. Pt VS otherwise stable. Surgical site intact. Can wiggle toes and has full sensation bilaterally. Pulses intact. Will continue to monitor.

## 2017-09-09 NOTE — Anesthesia Preprocedure Evaluation (Signed)
Anesthesia Evaluation  Patient identified by MRN, date of birth, ID band Patient confused    Reviewed: Allergy & Precautions, NPO status , Patient's Chart, lab work & pertinent test results, Unable to perform ROS - Chart review only  History of Anesthesia Complications Negative for: history of anesthetic complications  Airway Mallampati: II       Dental   Pulmonary neg sleep apnea, neg COPD,           Cardiovascular hypertension, Pt. on medications (-) Past MI and (-) CHF (-) dysrhythmias (-) Valvular Problems/Murmurs     Neuro/Psych neg Seizures Dementia    GI/Hepatic Neg liver ROS, neg GERD  ,  Endo/Other  neg diabetes  Renal/GU negative Renal ROS     Musculoskeletal   Abdominal   Peds  Hematology   Anesthesia Other Findings   Reproductive/Obstetrics                             Anesthesia Physical Anesthesia Plan  ASA: III  Anesthesia Plan: General   Post-op Pain Management:    Induction: Intravenous  PONV Risk Score and Plan: 3 and Dexamethasone, Ondansetron and Treatment may vary due to age or medical condition  Airway Management Planned: LMA and Oral ETT  Additional Equipment:   Intra-op Plan:   Post-operative Plan:   Informed Consent: I have reviewed the patients History and Physical, chart, labs and discussed the procedure including the risks, benefits and alternatives for the proposed anesthesia with the patient or authorized representative who has indicated his/her understanding and acceptance.     Plan Discussed with:   Anesthesia Plan Comments:         Anesthesia Quick Evaluation

## 2017-09-09 NOTE — [place] FL2 (Signed)
[place] MEDICAID FL2 LEVEL OF CARE SCREENING TOOL     IDENTIFICATION  Patient Name: Katie Fitzgerald Birthdate: 11/26/1940 Sex: female Admission Date (Current Location): 09/08/2017  [place] and [place] Number:  Chiropodist and Address:  [facility], [address] [place], [place] [address]      Provider Number: [id number]  Attending Physician Name and Address:  Alford Highland, MD  Relative Name and Phone Number:  Robina Ade [phone]    Current Level of Care: Hospital Recommended Level of Care: Skilled Nursing Facility Prior Approval Number:    Date Approved/Denied:   PASRR Number: [id number]  Discharge Plan: SNF    Current Diagnoses: Patient Active Problem List   Diagnosis Date Noted  . Hip fx (HCC) 03/22/2017  . Syncope 04/29/2015  . Fracture of left inferior pubic ramus (HCC) 04/29/2015    Orientation RESPIRATION BLADDER Height & Weight     Self, Time, Place  Normal Incontinent Weight: 108 lb 0.4 oz (49 kg) Height:  5\' 6"  (167.6 cm)  BEHAVIORAL SYMPTOMS/MOOD NEUROLOGICAL BOWEL NUTRITION STATUS  (none) (none) Continent Diet(NPO to be advanced)  AMBULATORY STATUS COMMUNICATION OF NEEDS Skin   Extensive Assist Verbally Normal                       Personal Care Assistance Level of Assistance  Bathing, Feeding, Dressing Bathing Assistance: Limited assistance Feeding assistance: Independent Dressing Assistance: Limited assistance     Functional Limitations Info  Sight, Hearing, Speech Sight Info: Adequate Hearing Info: Adequate Speech Info: Adequate    SPECIAL CARE FACTORS FREQUENCY  PT (By licensed PT), OT (By licensed OT)     PT Frequency: 5 OT Frequency: 5            Contractures Contractures Info: Not present    Additional Factors Info  Code Status, Allergies Code Status Info: Full Code  Allergies Info: Amoxicillin, Penicillin G           Current Medications (09/09/2017):  This is  the current hospital active medication list Current Facility-Administered Medications  Medication Dose Route Frequency Provider Last Rate Last Dose  . 0.9 %  sodium chloride infusion   Intravenous Continuous Cammy Copa, MD 75 mL/hr at 09/09/17 0030 75 mL/hr at 09/09/17 0030  . acetaminophen (TYLENOL) tablet 650 mg  650 mg Oral Q6H PRN Cammy Copa, MD       Or  . acetaminophen (TYLENOL) suppository 650 mg  650 mg Rectal Q6H PRN Cammy Copa, MD      . bisacodyl (DULCOLAX) EC tablet 5 mg  5 mg Oral Daily PRN Cammy Copa, MD      . citalopram (CELEXA) tablet 20 mg  20 mg Oral Daily Cammy Copa, MD      . clindamycin (CLEOCIN) IVPB 300 mg  300 mg Intravenous Once Kennedy Bucker, MD      . docusate sodium (COLACE) capsule 100 mg  100 mg Oral BID Cammy Copa, MD   100 mg at 09/09/17 0048  . gabapentin (NEURONTIN) capsule 300 mg  300 mg Oral BID Cammy Copa, MD   300 mg at 09/09/17 0047  . HYDROcodone-acetaminophen (NORCO/VICODIN) 5-325 MG per tablet 1-2 tablet  1-2 tablet Oral Q4H PRN Cammy Copa, MD   1 tablet at 09/09/17 0048  . memantine (NAMENDA) tablet 10 mg  10 mg Oral BID Cammy Copa, MD   10 mg at 09/09/17 0048  . ondansetron (ZOFRAN) tablet 4 mg  4 mg Oral  Q6H PRN Cammy CopaMaier, Angela, MD       Or  . ondansetron [facility](ZOFRAN) injection 4 mg  4 mg Intravenous Q6H PRN Cammy CopaMaier, Angela, MD      . potassium chloride SA (K-DUR,KLOR-CON) CR tablet 20 mEq  20 mEq Oral Daily Cammy CopaMaier, Angela, MD      . traZODone (DESYREL) tablet 25 mg  25 mg Oral QHS PRN Cammy CopaMaier, Angela, MD   25 mg at 09/09/17 [id number]     Discharge Medications: Please see discharge summary for a list of discharge medications.  Relevant Imaging Results:  Relevant Lab Results:   Additional Information SSN [id number]  Ruthe Mannanandace  Nyeem Stoke, [place]

## 2017-09-10 ENCOUNTER — Encounter: Payer: Self-pay | Admitting: Orthopedic Surgery

## 2017-09-10 LAB — BASIC METABOLIC PANEL
Anion gap: 4 — ABNORMAL LOW (ref 5–15)
BUN: 15 mg/dL (ref 8–23)
CHLORIDE: 110 mmol/L (ref 98–111)
CO2: 25 mmol/L (ref 22–32)
Calcium: 7.7 mg/dL — ABNORMAL LOW (ref 8.9–10.3)
Creatinine, Ser: 0.67 mg/dL (ref 0.44–1.00)
GFR calc non Af Amer: >60 mL/min (ref >60)
GLUCOSE: 102 mg/dL — AB (ref 70–99)
POTASSIUM: 3.6 mmol/L (ref 3.5–5.1)
Sodium: 139 mmol/L (ref 135–145)

## 2017-09-10 LAB — CBC
HEMATOCRIT: 24.1 % — AB (ref 35.0–47.0)
HEMOGLOBIN: 8 g/dL — AB (ref 12.0–16.0)
MCH: 31.9 pg (ref 26.0–34.0)
MCHC: 33.2 g/dL (ref 32.0–36.0)
MCV: 95.9 fL (ref 80.0–100.0)
Platelets: 233 10*3/uL (ref 150–440)
RBC: 2.51 MIL/uL — AB (ref 3.80–5.20)
RDW: 14.1 % (ref 11.5–14.5)
WBC: 9.3 10*3/uL (ref 3.6–11.0)

## 2017-09-10 LAB — GLUCOSE, CAPILLARY: Glucose-Capillary: 85 mg/dL (ref 70–99)

## 2017-09-10 MED ORDER — FE FUMARATE-B12-VIT C-FA-IFC PO CAPS
1.0000 | ORAL_CAPSULE | Freq: Two times a day (BID) | ORAL | Status: DC
  Administered 2017-09-10 – 2017-09-11 (×3): 1 via ORAL
  Filled 2017-09-10 (×4): qty 1

## 2017-09-10 MED ORDER — ENSURE ENLIVE PO LIQD
237.0000 mL | Freq: Two times a day (BID) | ORAL | Status: DC
  Administered 2017-09-11: 237 mL via ORAL

## 2017-09-10 MED ORDER — POLYETHYLENE GLYCOL 3350 17 G PO PACK
17.0000 g | PACK | Freq: Every day | ORAL | Status: DC
  Administered 2017-09-10 – 2017-09-11 (×2): 17 g via ORAL
  Filled 2017-09-10 (×2): qty 1

## 2017-09-10 MED ORDER — SODIUM CHLORIDE 0.9 % IV BOLUS
250.0000 mL | Freq: Once | INTRAVENOUS | Status: AC
  Administered 2017-09-10: 250 mL via INTRAVENOUS

## 2017-09-10 NOTE — Progress Notes (Addendum)
Patient ID: Katie Fitzgerald, female   DOB: [birth year], 77 y.o.   MRN: [id number]   [employer] PROGRESS NOTE  BYANCA KASPER NWG:[id number] DOB: 20-Feb-1940 DOA: 09/08/2017 PCP: Patient, No Pcp Per  HPI/Subjective: Patient feeling okay.  Offers no complaints.  I was called that the patient was hypotensive after I saw her and ordered a fluid bolus.  Objective: Vitals:   09/10/17 0753 09/10/17 1201  BP: (!) 122/58 (!) 96/45  Pulse: 89 83  Resp:    Temp: 98 F (36.7 C) 98.9 F (37.2 C)  SpO2: 98% 100%    Filed Weights   09/09/17 0500 09/09/17 1138 09/10/17 0414  Weight: 49 kg (108 lb 0.4 oz) 49 kg (108 lb) 49.5 kg (109 lb 2 oz)    ROS: Review of Systems  Unable to perform ROS: Dementia  Constitutional: Negative for fever.  Respiratory: Negative for cough and shortness of breath.   Cardiovascular: Negative for chest pain.  Gastrointestinal: Negative for abdominal pain.  Genitourinary: Negative for dysuria.  Musculoskeletal: Positive for joint pain.  Neurological: Negative for headaches.   Exam: Physical Exam  HENT:  Nose: No mucosal edema.  Mouth/Throat: No oropharyngeal exudate or posterior oropharyngeal edema.  Eyes: Pupils are equal, round, and reactive to light. Conjunctivae, EOM and lids are normal.  Neck: No JVD present. Carotid bruit is not present. No edema present. No thyroid mass and no thyromegaly present.  Cardiovascular: S1 normal and S2 normal. Exam reveals no gallop.  No murmur heard. Pulses:      Dorsalis pedis pulses are 2+ on the right side, and 2+ on the left side.  Respiratory: No respiratory distress. She has no wheezes. She has no rhonchi. She has no rales.  GI: Soft. Bowel sounds are normal. There is no tenderness.  Musculoskeletal:       Left ankle: She exhibits no swelling.  Lymphadenopathy:    She has no cervical adenopathy.  Neurological: She is alert. No cranial nerve deficit.  Skin: Skin is warm. No rash noted. Nails show no  clubbing.  Psychiatric: She has a normal mood and affect.      Data Reviewed: Basic Metabolic Panel: Recent Labs  Lab 09/08/17 2051 09/09/17 0422 09/10/17 0509  NA 141 142 139  K 3.7 3.8 3.6  CL 107 107 110  CO2 25 28 25   GLUCOSE 138* 114* 102*  BUN 20 22 15   CREATININE 0.72 0.86 0.67  CALCIUM 8.8* 9.1 7.7*   Liver Function Tests: Recent Labs  Lab 09/08/17 2051  AST 20  ALT 17  ALKPHOS 68  BILITOT 0.6  PROT 6.5  ALBUMIN 3.7   CBC: Recent Labs  Lab 09/08/17 2051 09/09/17 0422 09/10/17 0509  WBC 8.7 11.6* 9.3  NEUTROABS 5.4  --   --   HGB 12.0 10.8* 8.0*  HCT 36.0 32.0* 24.1*  MCV 95.1 94.7 95.9  PLT 315 307 233     Studies: Dg Chest 1 View  Result Date: 09/08/2017 CLINICAL DATA:  Fall with LEFT hip fracture. EXAM: CHEST  1 VIEW COMPARISON:  None FINDINGS: The cardiomediastinal silhouette is unremarkable. There is no evidence of focal airspace disease, pulmonary edema, suspicious pulmonary nodule/mass, pleural effusion, or pneumothorax. No acute bony abnormalities are identified. IMPRESSION: No active disease. Electronically Signed   By: Harmon Pier M.D.   On: 09/08/2017 21:41   Dg Hip Operative Unilat W Or W/o Pelvis Left  Result Date: 09/09/2017 CLINICAL DATA:  Status post ORIF for a left hip  fracture. EXAM: OPERATIVE left HIP (WITH PELVIS IF PERFORMED) 3 VIEWS TECHNIQUE: Fluoroscopic spot image(s) were submitted for interpretation post-operatively. COMPARISON:  Preoperative study of September 08, 2017 FINDINGS: The patient has undergone ORIF for a comminuted intertrochanteric fracture of the left hip. The intramedullary rod and telescoping screw are in appropriate position. Alignment is near anatomic. There is avulsion of the lesser trochanter. IMPRESSION: No immediate complication following ORIF of an intertrochanteric left femoral fracture. Electronically Signed   By: David  Swazil[place]Jordan M.D.   On: 09/09/2017 12:57   Dg Hip Unilat With Pelvis 2-3 Views Left  Result  Date: 09/08/2017 CLINICAL DATA:  Fall EXAM: DG HIP (WITH OR WITHOUT PELVIS) 2-3V LEFT COMPARISON:  04/29/2015 FINDINGS: There is a comminuted intertrochanteric fracture of the proximal left femur. The femoral head remains situated in the acetabulum. There are 3 cannulated lag screws traversing the right femoral neck without abnormal perihardware lucency. IMPRESSION: Comminuted intertrochanteric fracture of the left femur. Electronically Signed   By: Deatra RobinsonKevin  Herman M.D.   On: 09/08/2017 21:39    Scheduled Meds: . citalopram  20 mg Oral Daily  . docusate sodium  100 mg Oral BID  . enoxaparin (LOVENOX) injection  40 mg Subcutaneous Q24H  . ferrous fumarate-b12-vitamic C-folic acid  1 capsule Oral BID PC  . gabapentin  300 mg Oral BID  . memantine  10 mg Oral BID  . polyethylene glycol  17 g Oral Daily  . potassium chloride SA  20 mEq Oral Daily   Continuous Infusions: . methocarbamol (ROBAXIN) IV    . sodium chloride 250 mL (09/10/17 1230)    Assessment/Plan:  1. Postoperative anemia and relative hypotension.  Fluid bolus today.  Check hemoglobin tomorrow for need for transfusion.   2. Left hip fracture requiring operative repair.  Mortality high within the first year of hip fracture especially if they do not get up and walk.  Potentially out to rehab tomorrow versus Thursday. 3. Dementia on Namenda.  Patient has 24/7 care at home. 4. Neuropathy on gabapentin  5. depression on Celexa   Code Status:     Code Status Orders  (From admission, onward)        Start     Ordered   09/09/17 0025  Full code  Continuous     09/09/17 0024    Code Status History    Date Active Date Inactive Code Status Order ID Comments User Context   03/22/2017 2233 03/25/2017 1531 Full Code [id number]  Bertrum SolSalary, Montell D, MD Inpatient   04/29/2015 1551 05/01/2015 1921 Full Code [id number]  Shaune Pollackhen, Qing, MD Inpatient     Family Communication:  spoke with 1 of the caregivers at the bedside Disposition Plan: Likely  out to rehab tomorrow if does not need a blood transfusion versus Thursday.  Consultants:  Orthopedic surgery  Time spent: 27 minutes  Renezmae Canlas [employer]  [employer]

## 2017-09-10 NOTE — Clinical Social Work Placement (Signed)
   CLINICAL SOCIAL WORK PLACEMENT  NOTE  Date:  09/10/2017  Patient Details  Name: Katie Fitzgerald MRN: [id number] Date of Birth: 03/05/1940  Clinical Social Work is seeking post-discharge placement for this patient at the Skilled  Nursing Facility level of care (*CSW will initial, date and re-position this form in  chart as items are completed):  Yes   Patient/family provided with [facility] Clinical Social Work Department's list of facilities offering this level of care within the geographic area requested by the patient (or if unable, by the patient's family).  Yes   Patient/family informed of their freedom to choose among providers that offer the needed level of care, that participate in Medicare, Medicaid or managed care program needed by the patient, have an available bed and are willing to accept the patient.  Yes   Patient/family informed of [facility]'s ownership interest in [facility] and [facility], as well as of the fact that they are under no obligation to receive care at these facilities.  PASRR submitted to EDS on       PASRR number received on       Existing PASRR number confirmed on 09/09/17     FL2 transmitted to all facilities in geographic area requested by pt/family on 09/10/17     FL2 transmitted to all facilities within larger geographic area on       Patient informed that his/her managed care company has contracts with or will negotiate with certain facilities, including the following:            Patient/family informed of bed offers received.  Patient chooses bed at       Physician recommends and patient chooses bed at      Patient to be transferred to   on  .  Patient to be transferred to facility by       Patient family notified on   of transfer.  Name of family member notified:        PHYSICIAN       Additional Comment:    _______________________________________________ Jaira Canady, Darleen CrockerBailey M, LCSW 09/10/2017, 11:24 AM

## 2017-09-10 NOTE — Clinical Social Work Note (Signed)
Clinical Social Work Assessment  Patient Details  Name: Katie Fitzgerald MRN: [id number] Date of Birth: 02-Aug-1940  Date of referral:  09/10/17               Reason for consult:  Facility Placement                Permission sought to share information with:  Chartered certified accountant granted to share information::  Yes, Verbal Permission Granted  Name::      [place]::   [place]   Relationship::     Contact Information:     Housing/Transportation Living arrangements for the past 2 months:  [place] of Information:  Patient, Friend/Neighbor Patient Interpreter Needed:  None Criminal Activity/Legal Involvement Pertinent to Current Situation/Hospitalization:  No - Comment as needed Significant Relationships:  Adult Children Lives with:  Friends Do you feel safe going back to the place where you live?  Yes Need for family participation in patient care:  Yes (Comment)  Care giving concerns:  Patient lives in [place] and her friend/caregiver Dot stays with her at night.    Social Worker assessment / plan:  Holiday representative (CSW) received SNF consult. PT is recommending SNF. CSW met with patient and her friend Dot was at bedside. Patient was alert and oriented X2 and was sitting up in the chair. CSW introduced self and explained role of CSW department. Per patient she lives in [place] and her friend Dot stays with her at night. CSW explained that PT is recommending SNF and [employer] will have to approve it. Patient's friend Dot reported that patient went to [facility] last time and did not like it. Dot reported that patient's daughter [name] will make the SNF decision and is flying home today. Per Dot patient's daughter should be in town around 2 pm. CSW attempted to contact patient's daughter [name] however she did not answer and her voicemail was not set up. FL2 complete and faxed out. CSW will continue to follow  and assist as needed.    Employment status:  Retired Nurse, adult PT Recommendations:  [place] / Referral to community resources:  [place]  Patient/Family's Response to care:  Patient is agreeable to [facility] in [place].   Patient/Family's Understanding of and Emotional Response to Diagnosis, Current Treatment, and Prognosis:  Patient was very pleasant and thanked CSW for assistance.   Emotional Assessment Appearance:  Appears stated age Attitude/Demeanor/Rapport:    Affect (typically observed):  Pleasant Orientation:  Oriented to Self, Oriented to Place, Fluctuating Orientation (Suspected and/or reported Sundowners) Alcohol / Substance use:  Not Applicable Psych involvement (Current and /or in the community):  No (Comment)  Discharge Needs  Concerns to be addressed:  Discharge Planning Concerns Readmission within the last 30 days:  No Current discharge risk:  Dependent with Mobility Barriers to Discharge:  Continued Medical Work up   [employer], Veronia Beets, LCSW 09/10/2017, 11:25 AM

## 2017-09-10 NOTE — Evaluation (Signed)
Occupational Therapy Evaluation Patient Details Name: Katie MainsDianne V Fitzgerald MRN: [id number] DOB: 03/18/1940 Today's Date: 09/10/2017    History of Present Illness Katie Fitzgerald is a 77yo female who [facility] after witnessed mechanical fall at home, found to have a hip fracture, now s/p Lt hip ORIR under Dr. Rosita KeaMenz, [facility]. PMH: fall/Rt hip fracture s/p Rt hip ORIF, fall and ankle fracture (managed conservatively), dementia,. HTN, OA. PTA pt was AMB c [facility] househodl distances, had 24/7 caregiver assistance.    Clinical Impression   Pt seen for OT evaluation this date, POD#1 from above surgery. Pt was reliant on family/friend Dot for assist with bathing and dressing as well as household tasks, meal prep, medication mgt, and transportation. Dot stays with pt overnight in pt's home. Daughter endorses multiple falls in the past 12 months. Pt is pleasant, follows commands with heavy verbal and visual cues, and is pleasantly confused, but is able to verbalize people in the room and oriented to self. Pt currently requires mod-max assist for LB dressing and bathing at bed level due to pain, impaired cognition, impaired activity tolerance, and limited AROM of L hip. Pt would benefit from skilled OT services including edu/training in self care skills, falls prevention strategies, home/routines modifications, DME/AE for LB bathing and dressing tasks, and compression stocking mgt strategies to support recall and carryover of learned skills to maximize safety/independence and minimize caregiver burden. Recommend STR upon discharge.    Follow Up Recommendations  SNF    Equipment Recommendations  Other (comment)(TBD)    Recommendations for Other Services       Precautions / Restrictions Precautions Precautions: Fall Restrictions Weight Bearing Restrictions: Yes LLE Weight Bearing: Weight bearing as tolerated      Mobility Bed Mobility Overal bed mobility: Needs Assistance Bed Mobility: Supine  to Sit;Sit to Supine     Supine to sit: Max assist Sit to supine: Max assist       Balance Overall balance assessment: History of Falls;Needs assistance Sitting-balance support: Feet supported Sitting balance-Leahy Scale: Fair                               ADL either performed or assessed with clinical judgement   ADL Overall ADL's : Needs assistance/impaired Eating/Feeding: Bed level;Supervision/ safety;Set up   Grooming: Bed level;Set up;Supervision/safety   Upper Body Bathing: Bed level;Moderate assistance   Lower Body Bathing: Bed level;Maximal assistance   Upper Body Dressing : Bed level;Moderate assistance   Lower Body Dressing: Bed level;Maximal assistance                       Vision Baseline Vision/History: Wears glasses Wears Glasses: At all times Patient Visual Report: No change from baseline       Perception     Praxis      Pertinent Vitals/Pain Pain Assessment: Faces Faces Pain Scale: Hurts a little bit Pain Location: left thigh with mobility Pain Descriptors / Indicators: Aching Pain Intervention(s): Limited activity within patient's tolerance;Monitored during session;Premedicated before session     Hand Dominance Right   Extremity/Trunk Assessment Upper Extremity Assessment Upper Extremity Assessment: Generalized weakness   Lower Extremity Assessment Lower Extremity Assessment: Generalized weakness(unable to formally assess LLE 2/2 pain w/ mvmt)   Cervical / Trunk Assessment Cervical / Trunk Assessment: Kyphotic   Communication Communication Communication: No difficulties   Cognition Arousal/Alertness: Awake/alert Behavior During Therapy: WFL for tasks assessed/performed Overall  Cognitive Status: History of cognitive impairments - at baseline                                 General Comments: pleasant, follows commands with heavy verbal and visual cues, poor short term memory function.    General  Comments       Exercises    Shoulder Instructions      Home Living Family/patient expects to be discharged to:: Private residence Living Arrangements: Non-relatives/Friends Available Help at Discharge: Family;Personal care attendant;Available PRN/intermittently(24/7 caregiver assistance PTA) Type of Home: House Home Access: Level entry     Home Layout: One level     Bathroom Shower/Tub: Producer, television/film/video: Handicapped height     Home Equipment: Cane - single point;Walker - 2 wheels;Bedside commode;Shower seat - built in          Prior Functioning/Environment Level of Independence: Needs assistance  Gait / Transfers Assistance Needed: Mx recent falls in past year ADL's / Homemaking Assistance Needed: Per dtr and Dot, pt requires assist for bathing, dressing, and IADL including housekeeping, meal prep, medication mgt, and transportation            OT Problem List: Decreased strength;Decreased knowledge of use of DME or AE;Decreased cognition;Decreased activity tolerance;Decreased range of motion;Pain;Impaired balance (sitting and/or standing);Decreased safety awareness      OT Treatment/Interventions: Self-care/ADL training;Balance training;Therapeutic exercise;Therapeutic activities;Cognitive remediation/compensation;DME and/or AE instruction;Patient/family education    OT Goals(Current goals can be found in the care plan section) Acute Rehab OT Goals Patient Stated Goal: return to her dog, [name]  OT Goal Formulation: With patient/family Time For Goal Achievement: 09/24/17 Potential to Achieve Goals: Good ADL Goals Pt Will Perform Lower Body Dressing: sit to/from stand;with mod assist Pt Will Transfer to Toilet: bedside commode;ambulating;with mod assist(LRAD for amb) Additional ADL Goal #1: Pt will follow 2 step commands with mod verbal cues to perform ADL tasks while maximizing safety.  OT Frequency: Min 1X/week   Barriers to D/C:             Co-evaluation              AM-PAC PT "6 Clicks" Daily Activity     Outcome Measure Help from another person eating meals?: None Help from another person taking care of personal grooming?: A Little Help from another person toileting, which includes using toliet, bedpan, or urinal?: A Lot Help from another person bathing (including washing, rinsing, drying)?: A Lot Help from another person to put on and taking off regular upper body clothing?: A Little Help from another person to put on and taking off regular lower body clothing?: A Lot 6 Click Score: 16   End of Session    Activity Tolerance: Patient tolerated treatment well Patient left: in bed;with call bell/phone within reach;with bed alarm set;with family/visitor present  OT Visit Diagnosis: Other abnormalities of gait and mobility (R26.89)                Time: 1610-9604 OT Time Calculation (min): 12 min Charges:  OT General Charges $OT Visit: 1 Visit OT Evaluation $OT Eval Moderate Complexity: 1 Mod  Richrd Prime, MPH, MS, OTR/L ascom [phone] 09/10/17, 2:45 PM

## 2017-09-10 NOTE — Progress Notes (Signed)
   Subjective: 1 Day Post-Op Procedure(s) (LRB): INTRAMEDULLARY (IM) NAIL INTERTROCHANTRIC (Left) Patient reports pain as mild. Family member at bedside patient has been agitated.  Patient is well, and has had no acute complaints or problems Denies any CP, SOB, ABD pain. We will start therapy today.  Plan is to go Skilled nursing facility after hospital stay.  Objective: Vital signs in last 24 hours: Temp:  [98 F (36.7 C)-98.7 F (37.1 C)] 98 F (36.7 C) (08/07 0753) Pulse Rate:  [68-89] 89 (08/07 0753) Resp:  [10-18] 18 (08/07 0437) BP: (91-141)/(45-73) 122/58 (08/07 0753) SpO2:  [91 %-100 %] 98 % (08/07 0753) Weight:  [49 kg (108 lb)-49.5 kg (109 lb 2 oz)] 49.5 kg (109 lb 2 oz) (08/07 0414)  Intake/Output from previous day: 08/06 0701 - 08/07 0700 In: 1893.8 [P.O.:240; I.V.:1553.8; IV Piggyback:100] Out: 1200 [Urine:1000; Blood:200] Intake/Output this shift: No intake/output data recorded.  Recent Labs    09/08/17 2051 09/09/17 0422 09/10/17 0509  HGB 12.0 10.8* 8.0*   Recent Labs    09/09/17 0422 09/10/17 0509  WBC 11.6* 9.3  RBC 3.38* 2.51*  HCT 32.0* 24.1*  PLT 307 233   Recent Labs    09/09/17 0422 09/10/17 0509  NA 142 139  K 3.8 3.6  CL 107 110  CO2 28 25  BUN 22 15  CREATININE 0.86 0.67  GLUCOSE 114* 102*  CALCIUM 9.1 7.7*   No results for input(s): LABPT, INR in the last 72 hours.  EXAM General - Patient is Alert, Appropriate and Confused Extremity - Neurovascular intact Sensation intact distally Intact pulses distally Dorsiflexion/Plantar flexion intact Incision: scant drainage No cellulitis present Compartment soft Dressing - scant drainage Motor Function - intact, moving foot and toes well on exam.   Past Medical History:  Diagnosis Date  . Dementia   . Hypertension   . Osteoarthrosis     Assessment/Plan:   1 Day Post-Op Procedure(s) (LRB): INTRAMEDULLARY (IM) NAIL INTERTROCHANTRIC (Left) Active Problems:   Hip fx  (HCC)  Estimated body mass index is 17.61 kg/m as calculated from the following:   Height as of this encounter: 5\' 6"  (1.676 m).   Weight as of this encounter: 49.5 kg (109 lb 2 oz). Advance diet Up with therapy  Acute post op blood loss anemia - Hgb 8.0. Start Fe supplement. Recheck Hgb in am VSS Pain well controlled Needs BM   Lovenox 40 mg SQ daily x 14 days TED hose BLE x 6 weeks, remove at night Remove staples and apply steri strips on 09/23/2017 Follow-up with [facility] in 6 weeks  DVT Prophylaxis - Lovenox, TED hose and SCDs Weight-Bearing as tolerated to left leg   T. Cranston Neighborhris Gaines, PA-C [facility] 09/10/2017, 7:55 AM

## 2017-09-10 NOTE — Evaluation (Signed)
Physical Therapy Evaluation Patient Details Name: Katie Fitzgerald MRN: [id number] DOB: 05-11-1940 Today's Date: 09/10/2017   History of Present Illness  Katie Fitzgerald is a 77yo female who comes to [facility] on 8/5 after witnessed mechanical fall at home, found to have a hip fracture, now s/p Lt hip ORIR under Dr. Rosita Kea, now [facility]. PMH: fall/Rt hip fracture s/p Rt hip ORIF, fall and ankle fracture (managed conservatively), dementia,. HTN, OA. PTA pt was AMB c [facility] househodl distances, had 24/7 caregiver assistance.   Clinical Impression  Pt admitted with above diagnosis. Pt currently with functional limitations due to the deficits listed below (see "PT Problem List"). Upon entry, pt in bed, caregiver "Katie Fitzgerald" present. The pt is awake and agreeable to participate. The pt is alert and oriented to self, pleasant, conversational, and following simple commands consistently with heavy verbal and visual cues. Pt has difficulty attending to task, requiring constant redirection, also is frequently oriented to post surgical status. Functional mobility assessment demonstrates increased effort/time requirements, poor tolerance, and need for heavy physical assistance, whereas the patient performed these at a higher level of independence PTA. Pt demonstrated a strong recovery after her Rt hip fracture and is anticipated to recover well after STR stay, prior to return to home. Pt will benefit from skilled PT intervention to increase independence and safety with basic mobility in preparation for discharge to the venue listed below.       Follow Up Recommendations SNF;Supervision for mobility/OOB    Equipment Recommendations  None recommended by PT    Recommendations for Other Services       Precautions / Restrictions Precautions Precautions: Fall Restrictions Weight Bearing Restrictions: Yes LLE Weight Bearing: Weight bearing as tolerated      Mobility  Bed Mobility Overal bed mobility: Needs Assistance Bed  Mobility: Supine to Sit     Supine to sit: Total assist        Transfers Overall transfer level: Needs assistance Equipment used: None Transfers: Sit to/from Stand;Stand Pivot Transfers Sit to Stand: Max assist Stand pivot transfers: Total assist;+2 safety/equipment          Ambulation/Gait Ambulation/Gait assistance: (unable to tolerate weiht bearing d/t pain and weakness. )              Stairs            Wheelchair Mobility    Modified Rankin (Stroke Patients Only)       Balance                                             Pertinent Vitals/Pain Pain Assessment: Faces Faces Pain Scale: Hurts even more(0 at rest, 2 with exercises, 7 with transfers. ) Pain Location: left thigh  Pain Descriptors / Indicators: Aching Pain Intervention(s): Limited activity within patient's tolerance;Monitored during session;Premedicated before session    Home Living Family/patient expects to be discharged to:: Private residence Living Arrangements: Non-relatives/Friends Available Help at Discharge: Family;Personal care attendant;Available PRN/intermittently(24/7 caregiver assistance PTA) Type of Home: House Home Access: Level entry     Home Layout: One level Home Equipment: Cane - single point;Walker - 2 wheels;Bedside commode;Shower seat - built in Additional Comments: PTA was independent with bathing, but needed help with dressing.     Prior Function Level of Independence: Needs assistance               Hand  Dominance        Extremity/Trunk Assessment   Upper Extremity Assessment Upper Extremity Assessment: Generalized weakness    Lower Extremity Assessment Lower Extremity Assessment: Generalized weakness    Cervical / Trunk Assessment Cervical / Trunk Assessment: Kyphotic  Communication      Cognition Arousal/Alertness: Awake/alert Behavior During Therapy: WFL for tasks assessed/performed Overall Cognitive Status: History  of cognitive impairments - at baseline                                 General Comments: follows comands well, with heavy cuing, but significant short term memory impairment       General Comments      Exercises General Exercises - Lower Extremity Ankle Circles/Pumps: AROM;AAROM;Both;15 reps;Supine Short Arc Quad: AAROM;Left;10 reps;Supine Heel Slides: AAROM;Left;Supine;10 reps Hip ABduction/ADduction: Supine;AAROM;Left;10 reps   Assessment/Plan    PT Assessment Patient needs continued PT services  PT Problem List Decreased strength;Decreased range of motion;Decreased activity tolerance;Decreased balance;Decreased mobility;Decreased safety awareness;Decreased cognition;Decreased knowledge of use of DME       PT Treatment Interventions DME instruction;Gait training;Functional mobility training;Therapeutic activities;Therapeutic exercise;Patient/family education;Balance training    PT Goals (Current goals can be found in the Care Plan section)  Acute Rehab PT Goals Patient Stated Goal: return to her dog, Katie Fitzgerald  PT Goal Formulation: With patient/family Time For Goal Achievement: 09/24/17 Potential to Achieve Goals: Fair    Frequency BID   Barriers to discharge Decreased caregiver support caregivers unable to provide total assist    Co-evaluation               AM-PAC PT "6 Clicks" Daily Activity  Outcome Measure Difficulty turning over in bed (including adjusting bedclothes, sheets and blankets)?: Unable Difficulty moving from lying on back to sitting on the side of the bed? : Unable Difficulty sitting down on and standing up from a chair with arms (e.g., wheelchair, bedside commode, etc,.)?: Unable Help needed moving to and from a bed to chair (including a wheelchair)?: Total Help needed walking in hospital room?: Total Help needed climbing 3-5 steps with a railing? : Total 6 Click Score: 6    End of Session   Activity Tolerance: Patient tolerated  treatment well;Patient limited by pain Patient left: in chair;with call bell/phone within reach;with chair alarm set;with family/visitor present Nurse Communication: Mobility status PT Visit Diagnosis: Unsteadiness on feet (R26.81);Difficulty in walking, not elsewhere classified (R26.2);Repeated falls (R29.6);Muscle weakness (generalized) (M62.81)    Time: [id number] PT Time Calculation (min) (ACUTE ONLY): 33 min   Charges:   PT Evaluation $PT Eval Moderate Complexity: 1 Mod PT Treatments $Therapeutic Exercise: 8-22 mins $Therapeutic Activity: 8-22 mins        9:47 AM, 09/10/17 Rosamaria LintsAllan C Emmanuelle Hibbitts, PT, DPT Physical Therapist - [facility]  [phone] (ASCOM)    Kiaya Haliburton C 09/10/2017, 9:45 AM

## 2017-09-10 NOTE — Progress Notes (Signed)
Clinical Education officer, museum (CSW) met with patient's daughter [name] and presented bed offers. She chose [facility]. Per Seth Bake admissions coordinator at [facility] she will start [facility] SN[facility] authorization.   [employer], LCSW [phone]

## 2017-09-10 NOTE — Progress Notes (Signed)
Physical Therapy Treatment Patient Details Name: Katie Fitzgerald MRN: [id number] DOB: 26-Oct-1940 Today's Date: 09/10/2017    History of Present Illness Katie Fitzgerald is a 77yo female who comes to [facility] on 8/5 after witnessed mechanical fall at home, found to have a hip fracture, now s/p Lt hip ORIR under Dr. Rosita Kea, now [facility]. PMH: fall/Rt hip fracture s/p Rt hip ORIF, fall and ankle fracture (managed conservatively), dementia,. HTN, OA. PTA pt was AMB c [facility] househodl distances, had 24/7 caregiver assistance.     PT Comments    PT returning for second session this date, pt still up to chair, NA just finished taking vitals, noted 102/47. Pt continues to scan room for dog "[name]"  1-2x dper session, is distracted by Left hip discomfort >10x in session, stopping mid task to ask what happened, or if it is normal to be sore. Pt re-oriented to situation multiple times, with positive response and verbalization of reassurance, no noted anxiety with transfer this session as during AM. Pt progressing well. Session cut short as preacher arrived and pt asked to visit with preacher    Follow Up Recommendations  SNF;Supervision for mobility/OOB     Equipment Recommendations  None recommended by PT    Recommendations for Other Services       Precautions / Restrictions Precautions Precautions: Fall Restrictions Weight Bearing Restrictions: Yes LLE Weight Bearing: Weight bearing as tolerated    Mobility  Bed Mobility Overal bed mobility: Needs Assistance Bed Mobility: Supine to Sit;Sit to Supine     Supine to sit: Total assist Sit to supine: Max assist   General bed mobility comments: assisted with LLE and trunk, pt moves RLE fairly well  Transfers Overall transfer level: Needs assistance Equipment used: None Transfers: Sit to/from [employer] Sit to Stand: Mod assist Stand pivot transfers: Total assist;+2 physical assistance       General transfer comment: less  anxious this time, but require heavy physical assistace d/t decreased tolerance to LLE, frequently forgetting Lt fracture and ORIF and distracted by pain every few seconds, asking about etiology.   Ambulation/Gait Ambulation/Gait assistance: (no appropriate at this time. Hypotensive and distractible. )               Stairs             Wheelchair Mobility    Modified Rankin (Stroke Patients Only)       Balance Overall balance assessment: History of Falls;Needs assistance         Standing balance support: Bilateral upper extremity supported Standing balance-Leahy Scale: Poor Standing balance comment: requires everal seconds to establish feet under center of mass                            Cognition Arousal/Alertness: Awake/alert Behavior During Therapy: WFL for tasks assessed/performed Overall Cognitive Status: History of cognitive impairments - at baseline                                 General Comments: pleasant, poor short term memory function.       Exercises General Exercises - Lower Extremity Ankle Circles/Pumps: AAROM;Both;15 reps;Supine Long Arc Quad: AAROM;AROM;Left;10 reps;Seated    General Comments        Pertinent Vitals/Pain Pain Assessment: Faces Faces Pain Scale: Hurts a little bit Pain Location: left thigh  Pain Descriptors / Indicators: Aching Pain Intervention(s): Limited  activity within patient's tolerance;Premedicated before session    Home Living Family/patient expects to be discharged to:: Private residence Living Arrangements: Non-relatives/Friends Available Help at Discharge: Family;Personal care attendant;Available PRN/intermittently(24/7 caregiver assistance PTA) Type of Home: House Home Access: Level entry   Home Layout: One level Home Equipment: Cane - single point;Walker - 2 wheels;Bedside commode;Shower seat - built in      Prior Function Level of Independence: Needs assistance  Gait /  Transfers Assistance Needed: Mx recent falls in past year ADL's / Homemaking Assistance Needed: Per dtr and Dot, pt requires assist for bathing, dressing, and IADL including housekeeping, meal prep, medication mgt, and transportation     PT Goals (current goals can now be found in the care plan section) Acute Rehab PT Goals Patient Stated Goal: return to her dog, [name]  PT Goal Formulation: With patient/family Time For Goal Achievement: 09/24/17 Potential to Achieve Goals: Fair Progress towards PT goals: Progressing toward goals    Frequency    BID      PT Plan Current plan remains appropriate    Co-evaluation              AM-PAC PT "6 Clicks" Daily Activity  Outcome Measure  Difficulty turning over in bed (including adjusting bedclothes, sheets and blankets)?: Unable Difficulty moving from lying on back to sitting on the side of the bed? : Unable Difficulty sitting down on and standing up from a chair with arms (e.g., wheelchair, bedside commode, etc,.)?: Unable Help needed moving to and from a bed to chair (including a wheelchair)?: Total Help needed walking in hospital room?: Total Help needed climbing 3-5 steps with a railing? : Total 6 Click Score: 6    End of Session   Activity Tolerance: Patient tolerated treatment well;Patient limited by pain Patient left: with family/visitor present;in bed;with bed alarm set;with call bell/phone within reach;with SCD's reapplied Nurse Communication: Mobility status PT Visit Diagnosis: Unsteadiness on feet (R26.81);Difficulty in walking, not elsewhere classified (R26.2);Repeated falls (R29.6);Muscle weakness (generalized) (M62.81)     Time: [id number] PT Time Calculation (min) (ACUTE ONLY): 13 min  Charges:  $Therapeutic Activity: 8-22 mins                     2:40 PM, 09/10/17 Rosamaria LintsAllan C Oluwatobiloba Martin, PT, DPT Physical Therapist - [facility]  [phone] (ASCOM)     Fuad Forget  C 09/10/2017, 2:36 PM

## 2017-09-11 LAB — CBC
HCT: 25.5 % — ABNORMAL LOW (ref 35.0–47.0)
Hemoglobin: 8.7 g/dL — ABNORMAL LOW (ref 12.0–16.0)
MCH: 32.5 pg (ref 26.0–34.0)
MCHC: 34.2 g/dL (ref 32.0–36.0)
MCV: 94.9 fL (ref 80.0–100.0)
Platelets: 253 10*3/uL (ref 150–440)
RBC: 2.68 MIL/uL — ABNORMAL LOW (ref 3.80–5.20)
RDW: 14.1 % (ref 11.5–14.5)
WBC: 8.8 10*3/uL (ref 3.6–11.0)

## 2017-09-11 LAB — BASIC METABOLIC PANEL
Anion gap: 8 (ref 5–15)
BUN: 12 mg/dL (ref 8–23)
CO2: 26 mmol/L (ref 22–32)
Calcium: 8.3 mg/dL — ABNORMAL LOW (ref 8.9–10.3)
Chloride: 107 mmol/L (ref 98–111)
Creatinine, Ser: 0.63 mg/dL (ref 0.44–1.00)
GFR calc Af Amer: >60 mL/min (ref >60)
GFR calc non Af Amer: >60 mL/min (ref >60)
Glucose, Bld: 106 mg/dL — ABNORMAL HIGH (ref 70–99)
Potassium: 3.9 mmol/L (ref 3.5–5.1)
Sodium: 141 mmol/L (ref 135–145)

## 2017-09-11 LAB — GLUCOSE, CAPILLARY: GLUCOSE-CAPILLARY: 89 mg/dL (ref 70–99)

## 2017-09-11 MED ORDER — HYDROCODONE-ACETAMINOPHEN 5-325 MG PO TABS: 1.0000 | ORAL_TABLET | Freq: Four times a day (QID) | ORAL | 0 refills | Status: DC | PRN

## 2017-09-11 MED ORDER — TRAZODONE HCL 50 MG PO TABS: 25.0000 mg | ORAL_TABLET | Freq: Every evening | ORAL | 0 refills | Status: AC | PRN

## 2017-09-11 MED ORDER — FE FUMARATE-B12-VIT C-FA-IFC PO CAPS: 1.0000 | ORAL_CAPSULE | Freq: Two times a day (BID) | ORAL | 0 refills | Status: AC

## 2017-09-11 MED ORDER — ENOXAPARIN SODIUM 40 MG/0.4ML ~~LOC~~ SOLN: 40.0000 mg | SUBCUTANEOUS | 0 refills | Status: DC

## 2017-09-11 MED ORDER — ENSURE ENLIVE PO LIQD
237.0000 mL | Freq: Two times a day (BID) | ORAL | 0 refills | Status: AC
Start: 2017-09-11 — End: ?

## 2017-09-11 NOTE — Discharge Instructions (Signed)
Hip Fracture A hip fracture is a fracture of the upper part of your thigh bone (femur). What are the causes? A hip fracture is caused by a direct blow to the side of your hip. This is usually the result of a fall but can occur in other circumstances, such as an automobile accident. What increases the risk? There is an increased risk of hip fractures in people with:  An unsteady walking pattern (gait) and those with conditions that contribute to poor balance, such as Parkinson's disease or dementia.  Osteopenia and osteoporosis.  Cancer that spreads to the leg bones.  Certain metabolic diseases.  What are the signs or symptoms? Symptoms of hip fracture include:  Pain over the injured hip.  Inability to put weight on the leg in which the fracture occurred (although, some patients are able to walk after a hip fracture).  Toes and foot of the affected leg point outward when you lie down.  How is this diagnosed? A physical exam can determine if a hip fracture is likely to have occurred. X-ray exams are needed to confirm the fracture and to look for other injuries. The X-ray exam can help to determine the type of hip fracture. Rarely, the fracture is not visible on an X-ray image and a CT scan or MRI will have to be done. How is this treated? The treatment for a fracture is usually surgery. This means using a screw, nail, or rod to hold the bones in place. Follow these instructions at home: Take all medicines as directed by your health care provider. Contact a health care provider if: Pain continues, even after taking pain medicine. This information is not intended to replace advice given to you by your health care provider. Make sure you discuss any questions you have with your health care provider. Document Released: 01/21/2005 Document Revised: 06/29/2015 Document Reviewed: 09/02/2012 Elsevier Interactive Patient Education  2017 [employer] Diet: As you were doing prior to  hospitalization   Shower:  May shower but keep the wounds dry, use an occlusive plastic wrap, NO SOAKING IN TUB.  If the bandage gets wet, change with a clean dry gauze.  Dressing:  You may change your dressing as needed. Change the dressing with sterile gauze dressing.  Remove staples and apply steri strips on 09/23/2017. Follow-up with [facility] in 6 weeks    Activity:  Increase activity slowly as tolerated, but follow the weight bearing instructions below.  No lifting or driving for 6 weeks.  Weight Bearing:   Weight bearing as tolerated to left lower extremity  To prevent constipation: you may use a stool softener such as -  Colace (over the counter) 100 mg by mouth twice a day  Drink plenty of fluids (prune juice may be helpful) and high fiber foods Miralax (over the counter) for constipation as needed.    Itching:  If you experience itching with your medications, try taking only a single pain pill, or even half a pain pill at a time.  You may take up to 10 pain pills per day, and you can also use benadryl over the counter for itching or also to help with sleep.   Precautions:  If you experience chest pain or shortness of breath - call 911 immediately for transfer to the hospital emergency department!!  If you develop a fever greater that 101 F, purulent drainage from wound, increased redness or drainage from wound, or calf pain-Call [employer]  Follow- Up Appointment:  Please call for an appointment to be [facility]    Lovenox 40 mg SQ daily x 14 days TED hose BLE x 6 weeks, remove at night

## 2017-09-11 NOTE — Discharge Summary (Signed)
[facility] - [place] [facility]   PATIENT NAME: Katie DallasDianne Fitzgerald    MR#:  [id number]  DATE OF BIRTH:  11/01/1940  DATE OF ADMISSION:  09/08/2017 ADMITTING PHYSICIAN: Cammy CopaAngela Maier, MD  DATE OF DISCHARGE: 09/11/2017  PRIMARY CARE PHYSICIAN: Patient, No Pcp Per    ADMISSION DIAGNOSIS:  Fall [W19.XXXA] Fall, initial encounter [id number][W19.XXXA] Closed comminuted intertrochanteric fracture of left femur, initial encounter (HCC) [S72.142A]  DISCHARGE DIAGNOSIS:  Active Problems:   Hip fx (HCC)   SECONDARY DIAGNOSIS:   Past Medical History:  Diagnosis Date  . Dementia   . Hypertension   . Osteoarthrosis     HOSPITAL COURSE:   1.  Left hip fracture requiring operative repair.  Pain control with as needed medications.  Lovenox injections for 14 days.  Follow-up with Dr. Rosita KeaMenz as outpatient.  Patient stable for discharge to rehab.  Awaiting insurance authorization.  Wound care as per orthopedics 2.  Postoperative anemia.  Continue iron.  Check hemoglobin every couple weeks 3.  Dementia on Namenda. 4.  Neuropathy on gabapentin 5.  Depression on Celexa  DISCHARGE CONDITIONS:   Fair  CONSULTS OBTAINED:  Treatment Team:  Kennedy BuckerMenz, Michael, MD  DRUG ALLERGIES:   Allergies  Allergen Reactions  . Amoxicillin     Other reaction(s): Unknown  . Penicillin G     Other reaction(s): Unknown    DISCHARGE MEDICATIONS:   Allergies as of 09/11/2017      Reactions   Amoxicillin    Other reaction(s): Unknown   Penicillin G    Other reaction(s): Unknown      Medication List    TAKE these medications   acetaminophen 325 MG tablet Commonly known as:  TYLENOL Take 2 tablets (650 mg total) by mouth every 6 (six) hours as needed for mild pain (or Fever >/= 101).   citalopram 20 MG tablet Commonly known as:  CELEXA Take 1 tablet (20 mg total) by mouth daily.   docusate sodium 100 MG capsule Commonly known as:  COLACE Take 1 capsule (100 mg total) by mouth 2 (two) times  daily.   enoxaparin 40 MG/0.4ML injection Commonly known as:  LOVENOX Inject 0.4 mLs (40 mg total) into the skin daily.   feeding supplement (ENSURE ENLIVE) Liqd Take 237 mLs by mouth 2 (two) times daily between meals.   ferrous fumarate-b12-vitamic C-folic acid capsule Commonly known as:  TRINSICON / FOLTRIN Take 1 capsule by mouth 2 (two) times daily after a meal.   gabapentin 600 MG tablet Commonly known as:  NEURONTIN Take 0.5 tablets (300 mg total) by mouth 2 (two) times daily.   HYDROcodone-acetaminophen 5-325 MG tablet Commonly known as:  NORCO/VICODIN Take 1 tablet by mouth every 6 (six) hours as needed for moderate pain.   memantine 10 MG tablet Commonly known as:  NAMENDA Take 10 mg by mouth 2 (two) times daily.   polyethylene glycol packet Commonly known as:  MIRALAX / GLYCOLAX Take 17 g by mouth daily as needed for mild constipation.   potassium chloride SA 20 MEQ tablet Commonly known as:  K-DUR,KLOR-CON Take 1 tablet (20 mEq total) by mouth daily.   senna 8.6 MG Tabs tablet Commonly known as:  SENOKOT Take 1 tablet (8.6 mg total) by mouth 2 (two) times daily.   traZODone 50 MG tablet Commonly known as:  DESYREL Take 0.5 tablets (25 mg total) by mouth at bedtime as needed for sleep.        DISCHARGE INSTRUCTIONS:   Follow-up Dr.  rehab 1 day Follow-up Dr. Rosita Kea orthopedics  If you experience worsening of your admission symptoms, develop shortness of breath, life threatening emergency, suicidal or homicidal thoughts you must seek medical attention immediately by calling 911 or calling your MD immediately  if symptoms less severe.  You Must read complete instructions/literature along with all the possible adverse reactions/side effects for all the Medicines you take and that have been prescribed to you. Take any new Medicines after you have completely understood and accept all the possible adverse reactions/side effects.   Please note  You were cared  for by a hospitalist during your hospital stay. If you have any questions about your discharge medications or the care you received while you were in the hospital after you are discharged, you can call the unit and asked to speak with the hospitalist on call if the hospitalist that took care of you is not available. Once you are discharged, your primary care physician will handle any further medical issues. Please note that NO REFILLS for any discharge medications will be authorized once you are discharged, as it is imperative that you return to your primary care physician (or establish a relationship with a primary care physician if you do not have one) for your aftercare needs so that they can reassess your need for medications and monitor your lab values.    Today   CHIEF COMPLAINT:   Chief Complaint  Patient presents with  . Fall    HISTORY OF PRESENT ILLNESS:  Katie Fitzgerald  is a 77 y.o. female presented after fall and found to have a left hip fracture   VITAL SIGNS:  Blood pressure (!) 124/56, pulse 82, temperature 99 F (37.2 C), temperature source Oral, resp. rate 17, height 5\' 6"  (1.676 m), weight 49.2 kg, SpO2 98 %.    PHYSICAL EXAMINATION:  GENERAL:  77 y.o.-year-old patient lying in the bed with no acute distress.  EYES: Pupils equal, round, reactive to light and accommodation. No scleral icterus. Extraocular muscles intact.  HEENT: Head atraumatic, normocephalic. Oropharynx and nasopharynx clear.  NECK:  Supple, no jugular venous distention. No thyroid enlargement, no tenderness.  LUNGS: Normal breath sounds bilaterally, no wheezing, rales,rhonchi or crepitation. No use of accessory muscles of respiration.  CARDIOVASCULAR: S1, S2 normal. No murmurs, rubs, or gallops.  ABDOMEN: Soft, non-tender, non-distended. Bowel sounds present. No organomegaly or mass.  EXTREMITIES: No pedal edema, cyanosis, or clubbing.  NEUROLOGIC: Cranial nerves II through XII are intact. Muscle  strength 5/5 in all extremities. Sensation intact. Gait not checked.  PSYCHIATRIC: The patient is alert and oriented x 3.  SKIN: No obvious rash, lesion, or ulcer.   DATA REVIEW:   CBC Recent Labs  Lab 09/11/17 0518  WBC 8.8  HGB 8.7*  HCT 25.5*  PLT 253    Chemistries  Recent Labs  Lab 09/08/17 2051  09/11/17 0518  NA 141   < > 141  K 3.7   < > 3.9  CL 107   < > 107  CO2 25   < > 26  GLUCOSE 138*   < > 106*  BUN 20   < > 12  CREATININE 0.72   < > 0.63  CALCIUM 8.8*   < > 8.3*  AST 20  --   --   ALT 17  --   --   ALKPHOS 68  --   --   BILITOT 0.6  --   --    < > = values in  this interval not displayed.     Microbiology Results  Results for orders placed or performed during the hospital encounter of 09/08/17  Surgical pcr screen     Status: None   Collection Time: 09/09/17  7:43 AM  Result Value Ref Range Status   MRSA, PCR NEGATIVE NEGATIVE Final   Staphylococcus aureus NEGATIVE NEGATIVE Final    Comment: (NOTE) The Xpert SA Assay (FDA approved for NASAL specimens in patients 37 years of age and older), is one component of a comprehensive surveillance program. It is not intended to diagnose infection nor to guide or monitor treatment. Performed at [facility], [address] [place], [place] [address]     RADIOLOGY:  Dg Hip Operative Unilat W Or W/o Pelvis Left  Result Date: 09/09/2017 CLINICAL DATA:  Status post ORIF for a left hip fracture. EXAM: OPERATIVE left HIP (WITH PELVIS IF PERFORMED) 3 VIEWS TECHNIQUE: Fluoroscopic spot image(s) were submitted for interpretation post-operatively. COMPARISON:  Preoperative study of September 08, 2017 FINDINGS: The patient has undergone ORIF for a comminuted intertrochanteric fracture of the left hip. The intramedullary rod and telescoping screw are in appropriate position. Alignment is near anatomic. There is avulsion of the lesser trochanter. IMPRESSION: No immediate complication following ORIF of an  intertrochanteric left femoral fracture. Electronically Signed   By: David  [place] M.D.   On: 09/09/2017 12:57     Management plans discussed with the patient, family and they are in agreement.  CODE STATUS:     Code Status Orders  (From admission, onward)         Start     Ordered   09/09/17 0025  Full code  Continuous     09/09/17 0024        Code Status History    Date Active Date Inactive Code Status Order ID Comments User Context   03/22/2017 2233 03/25/2017 1531 Full Code [id number]  Bertrum Sol, MD Inpatient   04/29/2015 1551 05/01/2015 1921 Full Code [id number]  Shaune Pollack, MD Inpatient      TOTAL TIME TAKING CARE OF THIS PATIENT: 35 minutes.    Alford Highland M.D on 09/11/2017 at 7:52 AM  Between 7am to 6pm - Pager - [phone]  After 6pm go to [email or web address] - password [email or web address] [facility]  [facility] Office  [phone]  CC: Primary care physician; Patient, No Pcp Per

## 2017-09-11 NOTE — Progress Notes (Signed)
Physical Therapy Treatment Patient Details Name: Katie Fitzgerald MRN: [id number] DOB: 02/07/1940 Today's Date: 09/11/2017    History of Present Illness Katie Fitzgerald is a 77yo female who [facility] after witnessed mechanical fall at home, found to have a hip fracture, now s/p Lt hip ORIR under Dr. Rosita KeaMenz, now [facility]. PMH: fall/Rt hip fracture s/p Rt hip ORIF, fall and ankle fracture (managed conservatively), dementia,. HTN, OA. PTA pt was AMB c [facility] househodl distances, had 24/7 caregiver assistance.     PT Comments    Pt asleep upon entry, caregiver in room  Reports pt had a restless night. Pt appears to have less grimacing and guarding and requires less physical assistance with therex this date. Attention to task is maintained better while therapist counts repetitions aloud, pt requiring only occasional redirection. Surprisingly good tolerance to bridging activation, although she only is able to clear the mattress by ~2-3 inches at the buttocks. Pt falls asleep multiple times in session, hence additional mobility OOB is deferred at this time. Son enters room 10 minutes prior to end of session.   Follow Up Recommendations  SNF;Supervision for mobility/OOB     Equipment Recommendations  None recommended by PT    Recommendations for Other Services       Precautions / Restrictions Precautions Precautions: Fall Restrictions LLE Weight Bearing: Weight bearing as tolerated    Mobility  Bed Mobility Overal bed mobility: ((deferred this session d/t somnolence) )                Transfers                    Ambulation/Gait                 Stairs             Wheelchair Mobility    Modified Rankin (Stroke Patients Only)       Balance                                            Cognition Arousal/Alertness: Awake/alert Behavior During Therapy: WFL for tasks assessed/performed Overall Cognitive Status: History of cognitive  impairments - at baseline                                 General Comments: pleasant, follows commands with heavy verbal and visual cues, poor short term memory function.       Exercises Total Joint Exercises Bridges: Strengthening;Both;10 reps(activation) General Exercises - Lower Extremity Ankle Circles/Pumps: AAROM;Both;15 reps;Supine Short Arc Quad: AAROM;Left;Supine;15 reps(better activation this date. ) Heel Slides: AAROM;Left;Supine;15 reps(better activation this date. ) Hip ABduction/ADduction: Supine;AAROM;Left;10 reps;Limitations Hip Abduction/Adduction Limitations: still strongly limited by pain Mini-Sqauts: Strengthening;Left;15 reps;Supine    General Comments        Pertinent Vitals/Pain Pain Assessment: Faces Faces Pain Scale: Hurts a little bit Pain Location: left thigh with mobility Pain Intervention(s): Limited activity within patient's tolerance;Monitored during session;Premedicated before session    Home Living                      Prior Function            PT Goals (current goals can now be found in the care plan section) Acute Rehab PT Goals Patient Stated Goal:  return to her dog, [name]  PT Goal Formulation: With patient/family Time For Goal Achievement: 09/24/17 Potential to Achieve Goals: Fair Progress towards PT goals: Progressing toward goals    Frequency    BID      PT Plan Current plan remains appropriate    Co-evaluation              AM-PAC PT "6 Clicks" Daily Activity  Outcome Measure  Difficulty turning over in bed (including adjusting bedclothes, sheets and blankets)?: Unable Difficulty moving from lying on back to sitting on the side of the bed? : Unable Difficulty sitting down on and standing up from a chair with arms (e.g., wheelchair, bedside commode, etc,.)?: Unable Help needed moving to and from a bed to chair (including a wheelchair)?: Total Help needed walking in hospital room?: Total Help  needed climbing 3-5 steps with a railing? : Total 6 Click Score: 6    End of Session   Activity Tolerance: Patient tolerated treatment well;Patient limited by pain Patient left: with family/visitor present;in bed;with bed alarm set;with call bell/phone within reach;with SCD's reapplied Nurse Communication: Mobility status PT Visit Diagnosis: Unsteadiness on feet (R26.81);Difficulty in walking, not elsewhere classified (R26.2);Repeated falls (R29.6);Muscle weakness (generalized) (M62.81)     Time: 4098-1191 PT Time Calculation (min) (ACUTE ONLY): 14 min  Charges:  $Therapeutic Exercise: 8-22 mins                     9:54 AM, 09/11/17 Rosamaria Lints, PT, DPT Physical Therapist - [facility]  [phone] (ASCOM)      Sariya Trickey C 09/11/2017, 9:51 AM

## 2017-09-11 NOTE — Progress Notes (Signed)
Patient is medically stable for D/C to [facility] today. Per Sue LushAndrea admissions coordinator at [facility] patient can come today to room 328. Per Sue Lushndrea she will get [facility] once patient comes into the building. RN will call report at [phone] and arrange EMS for transport. Clinical Child psychotherapistocial Worker (CSW) sent D/C orders to [facility] via [facility]. Patient is aware of above. CSW contacted patient's daughter Wynona CanesChristine and made her aware of above. Please reconsult if future social work needs arise. CSW signing off.   [employer] Hughes IncorporatedBailey Hanford Lust, LCSW [phone]

## 2017-09-11 NOTE — Clinical Social Work Placement (Signed)
   CLINICAL SOCIAL WORK PLACEMENT  NOTE  Date:  09/11/2017  Patient Details  Name: Katie MainsDianne V Tomer MRN: [id number] Date of Birth: 08/13/1940  Clinical Social Work is seeking post-discharge placement for this patient at the Skilled  Nursing Facility level of care (*CSW will initial, date and re-position this form in  chart as items are completed):  Yes   Patient/family provided with [facility] Clinical Social Work Department's list of facilities offering this level of care within the geographic area requested by the patient (or if unable, by the patient's family).  Yes   Patient/family informed of their freedom to choose among providers that offer the needed level of care, that participate in Medicare, Medicaid or managed care program needed by the patient, have an available bed and are willing to accept the patient.  Yes   Patient/family informed of [facility]'s ownership interest in [facility] and [facility], as well as of the fact that they are under no obligation to receive care at these facilities.  PASRR submitted to EDS on       PASRR number received on       Existing PASRR number confirmed on 09/09/17     FL2 transmitted to all facilities in geographic area requested by pt/family on 09/10/17     FL2 transmitted to all facilities within larger geographic area on       Patient informed that his/her managed care company has contracts with or will negotiate with certain facilities, including the following:        Yes   Patient/family informed of bed offers received.  Patient [facility]     Physician recommends and patient chooses bed at      Patient to be transferred to [facility] on 09/11/17.  Patient to be transferred to facility by [facility] )     Patient family notified on 09/11/17 of transfer.  Name of family member notified:  (Patient's daughter Wynona CanesChristine is aware of D/C today. )     PHYSICIAN       Additional Comment:     _______________________________________________ Bette Brienza, Darleen CrockerBailey M, LCSW 09/11/2017, 10:03 AM

## 2017-09-11 NOTE — Progress Notes (Signed)
   Subjective: 2 Days Post-Op Procedure(s) (LRB): INTRAMEDULLARY (IM) NAIL INTERTROCHANTRIC (Left) Patient reports pain as mild. Family member at bedside patient has had some confusion. Patient is well, and has had no acute complaints or problems Denies any CP, SOB, ABD pain. Will continue therapy today. Plan is to go Skilled nursing facility after hospital stay. 99 temp last night.  Objective: Vital signs in last 24 hours: Temp:  [98 F (36.7 C)-99 F (37.2 C)] 99 F (37.2 C) (08/07 2302) Pulse Rate:  [82-96] 82 (08/07 2302) Resp:  [17] 17 (08/07 2302) BP: (96-148)/(45-80) 124/56 (08/07 2302) SpO2:  [97 %-100 %] 98 % (08/07 2302) Weight:  [49.2 kg] 49.2 kg (08/08 0543)  Intake/Output from previous day: 08/07 0701 - 08/08 0700 In: 610 [P.O.:360; IV Piggyback:250] Out: 1000 [Urine:1000] Intake/Output this shift: No intake/output data recorded.  Recent Labs    09/08/17 2051 09/09/17 0422 09/10/17 0509 09/11/17 0518  HGB 12.0 10.8* 8.0* 8.7*   Recent Labs    09/10/17 0509 09/11/17 0518  WBC 9.3 8.8  RBC 2.51* 2.68*  HCT 24.1* 25.5*  PLT 233 253   Recent Labs    09/10/17 0509 09/11/17 0518  NA 139 141  K 3.6 3.9  CL 110 107  CO2 25 26  BUN 15 12  CREATININE 0.67 0.63  GLUCOSE 102* 106*  CALCIUM 7.7* 8.3*   No results for input(s): LABPT, INR in the last 72 hours.  EXAM General - Patient is Alert, Appropriate and Confused Extremity - Neurovascular intact Sensation intact distally Intact pulses distally Dorsiflexion/Plantar flexion intact Incision: scant drainage No cellulitis present Compartment soft Dressing - scant drainage Motor Function - intact, moving foot and toes well on exam.   Past Medical History:  Diagnosis Date  . Dementia   . Hypertension   . Osteoarthrosis     Assessment/Plan:   2 Days Post-Op Procedure(s) (LRB): INTRAMEDULLARY (IM) NAIL INTERTROCHANTRIC (Left) Active Problems:   Hip fx (HCC)  Estimated body mass index  is 17.51 kg/m as calculated from the following:   Height as of this encounter: 5\' 6"  (1.676 m).   Weight as of this encounter: 49.2 kg. Advance diet Up with therapy   Acute post op blood loss anemia - Hgb 8.7. VSS Pt has had two bowel movements following surgery.  Lovenox 40 mg SQ daily x 14 days TED hose BLE x 6 weeks, remove at night Remove staples and apply steri strips on 09/23/2017 Follow-up with [facility] in 6 weeks  DVT Prophylaxis - Lovenox, TED hose and SCDs Weight-Bearing as tolerated to left leg  J. Horris LatinoLance Zaevion Parke, PA-C [facility] 09/11/2017, 7:10 AM

## 2017-09-11 NOTE — Progress Notes (Signed)
Pt ready for d/c home today per MD. Pt pleasantly confused this morning, had 2 BMs earlier. [facility] [place] [facility], all questions answered. VSS, PIV removed. Pt's friend, Dot, and son at bedside. EMS set up for transportation to facility.   BreeseHudson, Latricia HeftKorie G

## 2017-09-15 DIAGNOSIS — R441 Visual hallucinations: Secondary | ICD-10-CM

## 2017-09-15 DIAGNOSIS — G301 Alzheimer's disease with late onset: Secondary | ICD-10-CM | POA: Diagnosis not present

## 2017-09-15 DIAGNOSIS — F39 Unspecified mood [affective] disorder: Secondary | ICD-10-CM

## 2017-09-15 DIAGNOSIS — S7292XA Unspecified fracture of left femur, initial encounter for closed fracture: Secondary | ICD-10-CM | POA: Diagnosis not present

## 2017-09-15 DIAGNOSIS — M810 Age-related osteoporosis without current pathological fracture: Secondary | ICD-10-CM

## 2017-10-01 ENCOUNTER — Telehealth: Payer: Self-pay | Admitting: Internal Medicine

## 2017-10-01 NOTE — Telephone Encounter (Signed)
Copied from CRM [phone]#[id number]. Topic: Quick Communication - See Telephone Encounter >> Oct 01, 2017 12:53 PM Luanna Coleawoud, Jessica L wrote: CRM for notification. See Telephone encounter for: 10/01/17. Matthias HughsKecia calling from [facility] called and stated that pt was unable to keep appointment today 10/01/17 because daughter would like to be at the visit. She stated that daughter will be out of town for the holiday and will schedule when she gets back.

## 2017-10-01 NOTE — Telephone Encounter (Signed)
Spoke to [place]ecia and advised [facility] is not established at [facility]. States she was contacted by [facility] agent and previously advised, as well.

## 2018-02-09 ENCOUNTER — Emergency Department
Admission: EM | Admit: 2018-02-09 | Discharge: 2018-02-09 | Discharge status: Against Medical Advice | Payer: Medicare Other

## 2018-05-19 ENCOUNTER — Emergency Department: Payer: Medicare Other

## 2018-05-19 ENCOUNTER — Other Ambulatory Visit: Payer: Self-pay

## 2018-05-19 ENCOUNTER — Inpatient Hospital Stay
Admission: EM | Admit: 2018-05-19 | Discharge: 2018-05-22 | DRG: 482 | Disposition: A | Discharge status: Skilled Nursing Facility | Payer: Medicare Other | Attending: Family Medicine | Admitting: Family Medicine

## 2018-05-19 ENCOUNTER — Encounter: Payer: Self-pay | Admitting: Emergency Medicine

## 2018-05-19 DIAGNOSIS — Y92009 Unspecified place in unspecified non-institutional (private) residence as the place of occurrence of the external cause: Secondary | ICD-10-CM

## 2018-05-19 DIAGNOSIS — S72009A Fracture of unspecified part of neck of unspecified femur, initial encounter for closed fracture: Secondary | ICD-10-CM

## 2018-05-19 DIAGNOSIS — M810 Age-related osteoporosis without current pathological fracture: Secondary | ICD-10-CM | POA: Diagnosis present

## 2018-05-19 DIAGNOSIS — W010XXA Fall on same level from slipping, tripping and stumbling without subsequent striking against object, initial encounter: Secondary | ICD-10-CM | POA: Diagnosis present

## 2018-05-19 DIAGNOSIS — F0390 Unspecified dementia without behavioral disturbance: Secondary | ICD-10-CM | POA: Diagnosis present

## 2018-05-19 DIAGNOSIS — Z79899 Other long term (current) drug therapy: Secondary | ICD-10-CM

## 2018-05-19 DIAGNOSIS — S72141A Displaced intertrochanteric fracture of right femur, initial encounter for closed fracture: Principal | ICD-10-CM | POA: Diagnosis present

## 2018-05-19 DIAGNOSIS — I10 Essential (primary) hypertension: Secondary | ICD-10-CM | POA: Diagnosis present

## 2018-05-19 DIAGNOSIS — M25551 Pain in right hip: Secondary | ICD-10-CM | POA: Diagnosis not present

## 2018-05-19 DIAGNOSIS — F329 Major depressive disorder, single episode, unspecified: Secondary | ICD-10-CM | POA: Diagnosis present

## 2018-05-19 DIAGNOSIS — D509 Iron deficiency anemia, unspecified: Secondary | ICD-10-CM | POA: Diagnosis present

## 2018-05-19 DIAGNOSIS — M170 Bilateral primary osteoarthritis of knee: Secondary | ICD-10-CM | POA: Diagnosis present

## 2018-05-19 DIAGNOSIS — Z981 Arthrodesis status: Secondary | ICD-10-CM

## 2018-05-19 DIAGNOSIS — S7001XA Contusion of right hip, initial encounter: Secondary | ICD-10-CM

## 2018-05-19 DIAGNOSIS — S72144A Nondisplaced intertrochanteric fracture of right femur, initial encounter for closed fracture: Secondary | ICD-10-CM

## 2018-05-19 DIAGNOSIS — Z880 Allergy status to penicillin: Secondary | ICD-10-CM

## 2018-05-19 HISTORY — DX: Age-related osteoporosis without current pathological fracture: M81.0

## 2018-05-19 HISTORY — DX: Iron deficiency anemia, unspecified: D50.9

## 2018-05-19 MED ORDER — FENTANYL CITRATE (PF) 100 MCG/2ML IJ SOLN: 50.0000 ug | Freq: Once | INTRAMUSCULAR | Status: DC

## 2018-05-19 MED ORDER — FENTANYL CITRATE (PF) 100 MCG/2ML IJ SOLN
25.0000 ug | Freq: Once | INTRAMUSCULAR | Status: AC
  Administered 2018-05-20: 01:00:00 25 ug via INTRAVENOUS
  Filled 2018-05-19: qty 2

## 2018-05-19 NOTE — ED Notes (Signed)
Pt is unable to ambulate due to right leg/thigh pain. Pt was almost in tears when trying to move out of the bed. Scotty Court, MD made aware.

## 2018-05-19 NOTE — ED Triage Notes (Signed)
Pt arrived to the ED via EMS from home for complaints of right hip pain secondary to a mechanical fall caused by "tripping over her dog." Pt denies LOC or hitting her head. Pt is alert with advanced dementia.

## 2018-05-19 NOTE — Discharge Instructions (Signed)
Your x-rays of the right arm and right hip were okay and do not show any acute injuries.  Your pain appears to be due to soft tissue contusion which will gradually improve on its own.  Take Tylenol as needed and continue your other home medications.  It may be helpful to use a walker for additional stability since she will have pain with walking for the next several days.

## 2018-05-19 NOTE — ED Provider Notes (Addendum)
[facility] Emergency Department Provider Note  ____________________________________________  Time seen: Approximately 10:53 PM  I have reviewed the triage vital signs and the nursing notes.   HISTORY  Chief Complaint Fall and Hip Pain    Level 5 Caveat: Portions of the History and Physical including HPI and review of systems are unable to be completely obtained due to patient being a poor historian  Additional history obtained from daughter at bedside  HPI Katie Fitzgerald is a 78 y.o. female with a history of hypertension and dementia who was in her usual state of health today when she was approaching her pet dog which inadvertently got behind her feet causing the patient to trip and fall onto her right side.  No head injury or loss of consciousness.  Patient denies headache or neck pain.  She was initially complaining of right arm pain, and then started complaining of right hip pain as well.  She has had a history of bilateral hip fractures that have been surgically repaired.   No paresthesias or motor weakness.  No chest pain shortness of breath or recent illness.     Past Medical History:  Diagnosis Date  . Dementia (HCC)   . Hypertension   . Osteoarthrosis      Patient Active Problem List   Diagnosis Date Noted  . Hip fx (HCC) 03/22/2017  . Syncope 04/29/2015  . Fracture of left inferior pubic ramus (HCC) 04/29/2015     Past Surgical History:  Procedure Laterality Date  . HIP PINNING,CANNULATED Right 03/23/2017   Procedure: CANNULATED HIP PINNING;  Surgeon: Juanell Fairly, MD;  Location: [facility] ORS;  Service: Orthopedics;  Laterality: Right;  . INTRAMEDULLARY (IM) NAIL INTERTROCHANTERIC Left 09/09/2017   Procedure: INTRAMEDULLARY (IM) NAIL INTERTROCHANTRIC;  Surgeon: Kennedy Bucker, MD;  Location: [facility] ORS;  Service: Orthopedics;  Laterality: Left;  . KNEE SURGERY    . Open reduction internal fixation of the left olecranon fracture Left   .  POSTERIOR LUMBAR SPINE FUSION ONE LEVEL       Prior to Admission medications   Medication Sig Start Date End Date Taking? Authorizing Provider  acetaminophen (TYLENOL) 325 MG tablet Take 2 tablets (650 mg total) by mouth every 6 (six) hours as needed for mild pain (or Fever >/= 101). 03/25/17   Altamese Dilling, MD  citalopram (CELEXA) 20 MG tablet Take 1 tablet (20 mg total) by mouth daily. 03/25/17   Altamese Dilling, MD  docusate sodium (COLACE) 100 MG capsule Take 1 capsule (100 mg total) by mouth 2 (two) times daily. 03/25/17   Altamese Dilling, MD  enoxaparin (LOVENOX) 40 MG/0.4ML injection Inject 0.4 mLs (40 mg total) into the skin daily. 09/11/17   Alford Highland, MD  feeding supplement, ENSURE ENLIVE, (ENSURE ENLIVE) LIQD Take 237 mLs by mouth 2 (two) times daily between meals. 09/11/17   Alford Highland, MD  ferrous fumarate-b12-vitamic C-folic acid (TRINSICON / FOLTRIN) capsule Take 1 capsule by mouth 2 (two) times daily after a meal. 09/11/17   Alford Highland, MD  gabapentin (NEURONTIN) 600 MG tablet Take 0.5 tablets (300 mg total) by mouth 2 (two) times daily. 03/25/17   Altamese Dilling, MD  HYDROcodone-acetaminophen (NORCO/VICODIN) 5-325 MG tablet Take 1 tablet by mouth every 6 (six) hours as needed for moderate pain. 09/11/17   Alford Highland, MD  memantine (NAMENDA) 10 MG tablet Take 10 mg by mouth 2 (two) times daily. 04/15/15   [provider]  polyethylene glycol (MIRALAX / GLYCOLAX) packet Take 17 g  by mouth daily as needed for mild constipation. 03/25/17   Altamese Dilling, MD  potassium chloride SA (K-DUR,KLOR-CON) 20 MEQ tablet Take 1 tablet (20 mEq total) by mouth daily. 03/25/17   Altamese Dilling, MD  senna (SENOKOT) 8.6 MG TABS tablet Take 1 tablet (8.6 mg total) by mouth 2 (two) times daily. 03/25/17   Altamese Dilling, MD  traZODone (DESYREL) 50 MG tablet Take 0.5 tablets (25 mg total) by mouth at bedtime as needed for sleep.  09/11/17   Alford Highland, MD     Allergies Amoxicillin and Penicillin g   Family History  Problem Relation Age of Onset  . Dementia Mother   . Stroke Father     Social History Social History   Tobacco Use  . Smoking status: Never Smoker  . Smokeless tobacco: Never Used  Substance Use Topics  . Alcohol use: No  . Drug use: No    Review of Systems Level 5 Caveat: Portions of the History and Physical including HPI and review of systems are unable to be completely obtained due to patient being a poor historian   Constitutional:   No known fever.  ENT:   No rhinorrhea. Cardiovascular:   No chest pain or syncope. Respiratory:   No dyspnea or cough. Gastrointestinal:   Negative for abdominal pain, vomiting and diarrhea.  Musculoskeletal:   Positive for right arm and right hip pain ____________________________________________   PHYSICAL EXAM:  VITAL SIGNS: ED Triage Vitals  Enc Vitals Group     BP 05/19/18 2144 133/62     Pulse Rate 05/19/18 2144 73     Resp 05/19/18 2144 16     Temp 05/19/18 2144 98.2 F (36.8 C)     Temp Source 05/19/18 2144 Oral     SpO2 05/19/18 2144 95 %     Weight 05/19/18 2148 112 lb 7 oz (51 kg)     Height 05/19/18 2148  (1.676 m)     Head Circumference --      Peak Flow --      Pain Score 05/19/18 2148 5     Pain Loc --      Pain Edu? --      Excl. in GC? --     Vital signs reviewed, nursing assessments reviewed.   Constitutional:   Alert and oriented to person and place. Non-toxic appearance. Eyes:   Conjunctivae are normal. EOMI. PERRL. ENT      Head:   Normocephalic and atraumatic.      Nose:   No congestion/rhinnorhea.       Mouth/Throat:   MMM, no pharyngeal erythema. No peritonsillar mass.       Neck:   No meningismus. Full ROM.  No midline tenderness Hematological/Lymphatic/Immunilogical:   No cervical lymphadenopathy. Cardiovascular:   RRR. Symmetric bilateral radial and DP pulses.  Loud systolic murmur. Cap refill  less than 2 seconds. Respiratory:   Normal respiratory effort without tachypnea/retractions. Breath sounds are clear and equal bilaterally. No wheezes/rales/rhonchi. Gastrointestinal:   Soft and nontender. Non distended. There is no CVA tenderness.  No rebound, rigidity, or guarding. Musculoskeletal:   Normal range of motion in all extremities. No joint effusions.  No upper extremity tenderness.  There is very mild tenderness at the right hip, which is also aggravated by passive hip flexion.  No limb shortening or external rotation. Neurologic:   Normal speech and language.  Significant memory impairment Motor grossly intact. No acute focal neurologic deficits are appreciated.  Skin:  Skin is warm, dry and intact. No rash noted.  No petechiae, purpura, or bullae.  ____________________________________________    LABS (pertinent positives/negatives) (all labs ordered are listed, but only abnormal results are displayed) Labs Reviewed - No data to display ____________________________________________   EKG    ____________________________________________    RADIOLOGY  Dg Humerus Right  Result Date: 05/19/2018 CLINICAL DATA:  Right arm pain following fall, initial encounter EXAM: RIGHT HUMERUS - 2+ VIEW COMPARISON:  None. FINDINGS: There is no evidence of fracture or other focal bone lesions. Soft tissues are unremarkable. IMPRESSION: No acute abnormality noted. Electronically Signed   By: Alcide CleverMark  Lukens M.D.   On: 05/19/2018 22:33   Dg Hip Unilat W Or Wo Pelvis 2-3 Views Right  Result Date: 05/19/2018 CLINICAL DATA:  Tripped over dog with right hip pain, initial encounter EXAM: DG HIP (WITH OR WITHOUT PELVIS) 2-3V RIGHT COMPARISON:  03/23/2017 FINDINGS: Postsurgical changes are noted in the proximal femurs bilaterally. The pelvic ring is intact. Old healed fractures are noted. Postsurgical changes in the lower lumbar spine are seen. No acute fracture or hardware failure is noted.  IMPRESSION: Chronic postsurgical and posttraumatic changes. No acute abnormality noted. Electronically Signed   By: Alcide CleverMark  Lukens M.D.   On: 05/19/2018 22:32    ____________________________________________   PROCEDURES Procedures  ____________________________________________  DIFFERENTIAL DIAGNOSIS   Hip fracture, humerus fracture  CLINICAL IMPRESSION / ASSESSMENT AND PLAN / ED COURSE  Pertinent labs & imaging results that were available during my care of the patient were reviewed by me and considered in my medical decision making (see chart for details).   Katie Fitzgerald was evaluated in Emergency Department on 05/19/2018 for the symptoms described in the history of present illness. She was evaluated in the context of the global COVID-19 pandemic, which necessitated consideration that the patient might be at risk for infection with the SARS-CoV-2 virus that causes COVID-19. Institutional protocols and algorithms that pertain to the evaluation of patients at risk for COVID-19 are in a state of rapid change based on information released by regulatory bodies including the CDC and federal and state organizations. These policies and algorithms were followed during the patient's care in the ED.   Patient presents after mechanical fall with musculoskeletal pain complaints.  X-rays of the right humerus and right hip were unremarkable and do not show any acute fractures.  Likely has some degree of contusion, but otherwise stable for discharge home.  Will provide a walker to help with stability while she is having some hip pain for the next few days.  Clinical Course as of May 18 2317  Tue May 19, 2018  2318 Complains of ongoing pain, points at the mid thigh now.  No appreciable deformity or instability on exam or bony point tenderness.  With her inability to bear weight on the leg due to pain, will obtain CT of the pelvis and x-ray of the right femur.  25 mics of IV fentanyl for now for pain  control.  She may ultimately need hospitalization for pain control or PT/SW evaluation.   [PS]    Clinical Course User Index [PS] Sharman CheekStafford, Clarence Cogswell, MD     ____________________________________________   FINAL CLINICAL IMPRESSION(S) / ED DIAGNOSES    Final diagnoses:  Right hip pain  Contusion of right hip, initial encounter     ED Discharge Orders    None      Portions of this note were generated with dragon dictation software. Dictation errors may occur  despite best attempts at proofreading.   Sharman Cheek, MD 05/19/18 2256    Sharman Cheek, MD 05/19/18 2320

## 2018-05-20 ENCOUNTER — Other Ambulatory Visit: Payer: Self-pay

## 2018-05-20 ENCOUNTER — Encounter: Payer: Self-pay | Admitting: Orthopedic Surgery

## 2018-05-20 ENCOUNTER — Emergency Department: Payer: Medicare Other

## 2018-05-20 ENCOUNTER — Inpatient Hospital Stay: Payer: Medicare Other | Admitting: Anesthesiology

## 2018-05-20 ENCOUNTER — Inpatient Hospital Stay: Payer: Medicare Other

## 2018-05-20 ENCOUNTER — Encounter
Admission: EM | Disposition: A | Discharge status: Skilled Nursing Facility | Payer: Self-pay | Source: Home / Self Care | Attending: Family Medicine

## 2018-05-20 DIAGNOSIS — F0390 Unspecified dementia without behavioral disturbance: Secondary | ICD-10-CM | POA: Diagnosis present

## 2018-05-20 DIAGNOSIS — S72141A Displaced intertrochanteric fracture of right femur, initial encounter for closed fracture: Secondary | ICD-10-CM | POA: Diagnosis present

## 2018-05-20 DIAGNOSIS — Y92009 Unspecified place in unspecified non-institutional (private) residence as the place of occurrence of the external cause: Secondary | ICD-10-CM | POA: Diagnosis not present

## 2018-05-20 DIAGNOSIS — S72009A Fracture of unspecified part of neck of unspecified femur, initial encounter for closed fracture: Secondary | ICD-10-CM | POA: Diagnosis present

## 2018-05-20 DIAGNOSIS — M170 Bilateral primary osteoarthritis of knee: Secondary | ICD-10-CM | POA: Diagnosis present

## 2018-05-20 DIAGNOSIS — Z880 Allergy status to penicillin: Secondary | ICD-10-CM | POA: Diagnosis not present

## 2018-05-20 DIAGNOSIS — Z79899 Other long term (current) drug therapy: Secondary | ICD-10-CM | POA: Diagnosis not present

## 2018-05-20 DIAGNOSIS — D509 Iron deficiency anemia, unspecified: Secondary | ICD-10-CM | POA: Diagnosis present

## 2018-05-20 DIAGNOSIS — F329 Major depressive disorder, single episode, unspecified: Secondary | ICD-10-CM | POA: Diagnosis present

## 2018-05-20 DIAGNOSIS — Z981 Arthrodesis status: Secondary | ICD-10-CM | POA: Diagnosis not present

## 2018-05-20 DIAGNOSIS — M810 Age-related osteoporosis without current pathological fracture: Secondary | ICD-10-CM | POA: Diagnosis present

## 2018-05-20 DIAGNOSIS — W010XXA Fall on same level from slipping, tripping and stumbling without subsequent striking against object, initial encounter: Secondary | ICD-10-CM | POA: Diagnosis present

## 2018-05-20 DIAGNOSIS — M25551 Pain in right hip: Secondary | ICD-10-CM | POA: Diagnosis present

## 2018-05-20 DIAGNOSIS — I10 Essential (primary) hypertension: Secondary | ICD-10-CM | POA: Diagnosis present

## 2018-05-20 HISTORY — PX: INTRAMEDULLARY (IM) NAIL INTERTROCHANTERIC: SHX5875

## 2018-05-20 LAB — CBC WITH DIFFERENTIAL/PLATELET
Abs Immature Granulocytes: 0.06 10*3/uL (ref 0.00–0.07)
Basophils Absolute: 0.1 10*3/uL (ref 0.0–0.1)
Basophils Relative: 1 %
Eosinophils Absolute: 0.1 10*3/uL (ref 0.0–0.5)
Eosinophils Relative: 1 %
HCT: 35.7 % — ABNORMAL LOW (ref 36.0–46.0)
Hemoglobin: 11.4 g/dL — ABNORMAL LOW (ref 12.0–15.0)
Immature Granulocytes: 1 %
Lymphocytes Relative: 16 %
Lymphs Abs: 2 10*3/uL (ref 0.7–4.0)
MCH: 30.6 pg (ref 26.0–34.0)
MCHC: 31.9 g/dL (ref 30.0–36.0)
MCV: 96 fL (ref 80.0–100.0)
Monocytes Absolute: 0.9 10*3/uL (ref 0.1–1.0)
Monocytes Relative: 7 %
Neutro Abs: 9.2 10*3/uL — ABNORMAL HIGH (ref 1.7–7.7)
Neutrophils Relative %: 74 %
Platelets: 282 10*3/uL (ref 150–400)
RBC: 3.72 MIL/uL — ABNORMAL LOW (ref 3.87–5.11)
RDW: 13.2 % (ref 11.5–15.5)
WBC: 12.4 10*3/uL — ABNORMAL HIGH (ref 4.0–10.5)
nRBC: 0 % (ref 0.0–0.2)

## 2018-05-20 LAB — SURGICAL PCR SCREEN
MRSA, PCR: NEGATIVE
Staphylococcus aureus: NEGATIVE

## 2018-05-20 LAB — CBC
HCT: 36.8 % (ref 36.0–46.0)
Hemoglobin: 11.7 g/dL — ABNORMAL LOW (ref 12.0–15.0)
MCH: 30.6 pg (ref 26.0–34.0)
MCHC: 31.8 g/dL (ref 30.0–36.0)
MCV: 96.3 fL (ref 80.0–100.0)
Platelets: 282 10*3/uL (ref 150–400)
RBC: 3.82 MIL/uL — ABNORMAL LOW (ref 3.87–5.11)
RDW: 13.2 % (ref 11.5–15.5)
WBC: 10.5 10*3/uL (ref 4.0–10.5)
nRBC: 0 % (ref 0.0–0.2)

## 2018-05-20 LAB — PROTIME-INR
INR: 1 (ref 0.8–1.2)
Prothrombin Time: 13.2 seconds (ref 11.4–15.2)

## 2018-05-20 LAB — BASIC METABOLIC PANEL
Anion gap: 8 (ref 5–15)
Anion gap: 11 (ref 5–15)
BUN: 26 mg/dL — ABNORMAL HIGH (ref 8–23)
BUN: 25 mg/dL — ABNORMAL HIGH (ref 8–23)
CO2: 24 mmol/L (ref 22–32)
CO2: 23 mmol/L (ref 22–32)
Calcium: 9.1 mg/dL (ref 8.9–10.3)
Calcium: 9 mg/dL (ref 8.9–10.3)
Chloride: 105 mmol/L (ref 98–111)
Chloride: 104 mmol/L (ref 98–111)
Creatinine, Ser: 0.92 mg/dL (ref 0.44–1.00)
Creatinine, Ser: 0.79 mg/dL (ref 0.44–1.00)
GFR calc Af Amer: >60 mL/min (ref >60)
GFR calc Af Amer: >60 mL/min (ref >60)
GFR calc non Af Amer: >60 mL/min (ref >60)
GFR calc non Af Amer: >60 mL/min (ref >60)
Glucose, Bld: 112 mg/dL — ABNORMAL HIGH (ref 70–99)
Glucose, Bld: 130 mg/dL — ABNORMAL HIGH (ref 70–99)
Potassium: 3.9 mmol/L (ref 3.5–5.1)
Potassium: 3.8 mmol/L (ref 3.5–5.1)
Sodium: 137 mmol/L (ref 135–145)
Sodium: 138 mmol/L (ref 135–145)

## 2018-05-20 SURGERY — FIXATION, FRACTURE, INTERTROCHANTERIC, WITH INTRAMEDULLARY ROD
Anesthesia: Spinal | Laterality: Right

## 2018-05-20 MED ORDER — METOCLOPRAMIDE HCL 5 MG PO TABS: 5.0000 mg | ORAL_TABLET | Freq: Three times a day (TID) | ORAL | Status: DC | PRN

## 2018-05-20 MED ORDER — FENTANYL CITRATE (PF) 100 MCG/2ML IJ SOLN: 25.0000 ug | INTRAMUSCULAR | Status: DC | PRN

## 2018-05-20 MED ORDER — SODIUM CHLORIDE 0.9 % IV SOLN
INTRAVENOUS | Status: DC | PRN
  Administered 2018-05-20: 20 ug/min via INTRAVENOUS

## 2018-05-20 MED ORDER — SODIUM CHLORIDE 0.9 % IV SOLN
INTRAVENOUS | Status: DC
  Administered 2018-05-20: 05:00:00 via INTRAVENOUS

## 2018-05-20 MED ORDER — MEMANTINE HCL 5 MG PO TABS
10.0000 mg | ORAL_TABLET | Freq: Two times a day (BID) | ORAL | Status: DC
  Administered 2018-05-20 – 2018-05-22 (×4): 10 mg via ORAL
  Filled 2018-05-20 (×4): qty 2

## 2018-05-20 MED ORDER — FLEET ENEMA 7-19 GM/118ML RE ENEM: 1.0000 | ENEMA | Freq: Once | RECTAL | Status: DC | PRN

## 2018-05-20 MED ORDER — PROPOFOL 500 MG/50ML IV EMUL
INTRAVENOUS | Status: DC | PRN
  Administered 2018-05-20: 20 ug/kg/min via INTRAVENOUS

## 2018-05-20 MED ORDER — MENTHOL 3 MG MT LOZG
1.0000 | LOZENGE | OROMUCOSAL | Status: DC | PRN
  Filled 2018-05-20: qty 9

## 2018-05-20 MED ORDER — PANTOPRAZOLE SODIUM 40 MG PO TBEC
40.0000 mg | DELAYED_RELEASE_TABLET | Freq: Two times a day (BID) | ORAL | Status: DC
  Administered 2018-05-20 – 2018-05-22 (×4): 40 mg via ORAL
  Filled 2018-05-20 (×4): qty 1

## 2018-05-20 MED ORDER — FENTANYL CITRATE (PF) 100 MCG/2ML IJ SOLN
INTRAMUSCULAR | Status: DC | PRN
  Administered 2018-05-20: 50 ug via INTRAVENOUS

## 2018-05-20 MED ORDER — LABETALOL HCL 5 MG/ML IV SOLN: 20.0000 mg | INTRAVENOUS | Status: DC | PRN

## 2018-05-20 MED ORDER — METOCLOPRAMIDE HCL 5 MG/ML IJ SOLN: 5.0000 mg | Freq: Three times a day (TID) | INTRAMUSCULAR | Status: DC | PRN

## 2018-05-20 MED ORDER — FE FUMARATE-B12-VIT C-FA-IFC PO CAPS
1.0000 | ORAL_CAPSULE | Freq: Two times a day (BID) | ORAL | Status: DC
  Administered 2018-05-21 – 2018-05-22 (×3): 1 via ORAL
  Filled 2018-05-20 (×6): qty 1

## 2018-05-20 MED ORDER — CITALOPRAM HYDROBROMIDE 20 MG PO TABS
20.0000 mg | ORAL_TABLET | Freq: Every day | ORAL | Status: DC
  Administered 2018-05-21 – 2018-05-22 (×2): 20 mg via ORAL
  Filled 2018-05-20 (×2): qty 1

## 2018-05-20 MED ORDER — BISACODYL 10 MG RE SUPP: 10.0000 mg | Freq: Every day | RECTAL | Status: DC | PRN

## 2018-05-20 MED ORDER — PROPOFOL 10 MG/ML IV BOLUS
INTRAVENOUS | Status: DC | PRN
  Administered 2018-05-20 (×3): 20 mg via INTRAVENOUS

## 2018-05-20 MED ORDER — CLINDAMYCIN PHOSPHATE 600 MG/50ML IV SOLN
600.0000 mg | Freq: Four times a day (QID) | INTRAVENOUS | Status: AC
  Administered 2018-05-20 – 2018-05-21 (×2): 600 mg via INTRAVENOUS
  Filled 2018-05-20 (×2): qty 50

## 2018-05-20 MED ORDER — TRAZODONE HCL 50 MG PO TABS
25.0000 mg | ORAL_TABLET | Freq: Every evening | ORAL | Status: DC | PRN
  Administered 2018-05-20: 05:00:00 25 mg via ORAL
  Filled 2018-05-20: qty 1

## 2018-05-20 MED ORDER — ONDANSETRON HCL 4 MG/2ML IJ SOLN
4.0000 mg | Freq: Four times a day (QID) | INTRAMUSCULAR | Status: DC | PRN
  Administered 2018-05-20: 20:00:00 4 mg via INTRAVENOUS
  Filled 2018-05-20: qty 2

## 2018-05-20 MED ORDER — LACTATED RINGERS IV SOLN
INTRAVENOUS | Status: DC | PRN
  Administered 2018-05-20: 14:00:00 via INTRAVENOUS

## 2018-05-20 MED ORDER — BUPIVACAINE HCL (PF) 0.5 % IJ SOLN
INTRAMUSCULAR | Status: DC | PRN
  Administered 2018-05-20: 3 mL

## 2018-05-20 MED ORDER — ACETAMINOPHEN 325 MG PO TABS: 650.0000 mg | ORAL_TABLET | Freq: Four times a day (QID) | ORAL | Status: DC | PRN

## 2018-05-20 MED ORDER — POTASSIUM CHLORIDE CRYS ER 20 MEQ PO TBCR
20.0000 meq | EXTENDED_RELEASE_TABLET | Freq: Every day | ORAL | Status: DC
  Administered 2018-05-21 – 2018-05-22 (×2): 20 meq via ORAL
  Filled 2018-05-20 (×2): qty 1

## 2018-05-20 MED ORDER — GABAPENTIN 600 MG PO TABS
300.0000 mg | ORAL_TABLET | Freq: Two times a day (BID) | ORAL | Status: DC
  Administered 2018-05-20 – 2018-05-22 (×4): 300 mg via ORAL
  Filled 2018-05-20 (×4): qty 1

## 2018-05-20 MED ORDER — FENTANYL CITRATE (PF) 100 MCG/2ML IJ SOLN
INTRAMUSCULAR | Status: AC
  Filled 2018-05-20: qty 2

## 2018-05-20 MED ORDER — GENTAMICIN SULFATE 40 MG/ML IJ SOLN
INTRAMUSCULAR | Status: AC
  Filled 2018-05-20: qty 2

## 2018-05-20 MED ORDER — SODIUM CHLORIDE 0.9 % IV SOLN
INTRAVENOUS | Status: DC
  Administered 2018-05-21: 05:00:00 via INTRAVENOUS

## 2018-05-20 MED ORDER — MORPHINE SULFATE (PF) 2 MG/ML IV SOLN
2.0000 mg | INTRAVENOUS | Status: DC | PRN
  Administered 2018-05-20: 2 mg via INTRAVENOUS
  Filled 2018-05-20: qty 1

## 2018-05-20 MED ORDER — ACETAMINOPHEN 10 MG/ML IV SOLN
INTRAVENOUS | Status: DC | PRN
  Administered 2018-05-20: 1000 mg via INTRAVENOUS

## 2018-05-20 MED ORDER — PROPOFOL 10 MG/ML IV BOLUS
INTRAVENOUS | Status: AC
  Filled 2018-05-20: qty 20

## 2018-05-20 MED ORDER — HYDROCODONE-ACETAMINOPHEN 5-325 MG PO TABS
1.0000 | ORAL_TABLET | Freq: Four times a day (QID) | ORAL | Status: DC | PRN
  Administered 2018-05-20: 1 via ORAL
  Filled 2018-05-20: qty 1

## 2018-05-20 MED ORDER — MAGNESIUM HYDROXIDE 400 MG/5ML PO SUSP
30.0000 mL | Freq: Every day | ORAL | Status: DC | PRN
  Filled 2018-05-20: qty 30

## 2018-05-20 MED ORDER — CLINDAMYCIN PHOSPHATE 600 MG/50ML IV SOLN
600.0000 mg | INTRAVENOUS | Status: AC
  Administered 2018-05-20: 14:00:00 600 mg via INTRAVENOUS
  Filled 2018-05-20: qty 50

## 2018-05-20 MED ORDER — ENOXAPARIN SODIUM 40 MG/0.4ML ~~LOC~~ SOLN
40.0000 mg | SUBCUTANEOUS | Status: DC
  Administered 2018-05-21 – 2018-05-22 (×2): 40 mg via SUBCUTANEOUS
  Filled 2018-05-20 (×2): qty 0.4

## 2018-05-20 MED ORDER — OXYCODONE HCL 5 MG/5ML PO SOLN: 5.0000 mg | Freq: Once | ORAL | Status: DC | PRN

## 2018-05-20 MED ORDER — ACETAMINOPHEN 10 MG/ML IV SOLN
INTRAVENOUS | Status: AC
  Filled 2018-05-20: qty 100

## 2018-05-20 MED ORDER — OXYCODONE HCL 5 MG PO TABS: 10.0000 mg | ORAL_TABLET | ORAL | Status: DC | PRN

## 2018-05-20 MED ORDER — ONDANSETRON HCL 4 MG PO TABS: 4.0000 mg | ORAL_TABLET | Freq: Four times a day (QID) | ORAL | Status: DC | PRN

## 2018-05-20 MED ORDER — SENNA 8.6 MG PO TABS: 1.0000 | ORAL_TABLET | Freq: Two times a day (BID) | ORAL | Status: DC

## 2018-05-20 MED ORDER — MAGNESIUM HYDROXIDE 400 MG/5ML PO SUSP
30.0000 mL | Freq: Every day | ORAL | Status: DC | PRN
  Administered 2018-05-21: 30 mL via ORAL
  Filled 2018-05-20 (×2): qty 30

## 2018-05-20 MED ORDER — OXYCODONE HCL 5 MG PO TABS
5.0000 mg | ORAL_TABLET | ORAL | Status: DC | PRN
  Administered 2018-05-20: 5 mg via ORAL
  Filled 2018-05-20: qty 1

## 2018-05-20 MED ORDER — POLYETHYLENE GLYCOL 3350 17 G PO PACK: 17.0000 g | PACK | Freq: Every day | ORAL | Status: DC | PRN

## 2018-05-20 MED ORDER — ONDANSETRON HCL 4 MG/2ML IJ SOLN: 4.0000 mg | Freq: Four times a day (QID) | INTRAMUSCULAR | Status: DC | PRN

## 2018-05-20 MED ORDER — OXYCODONE HCL 5 MG PO TABS: 5.0000 mg | ORAL_TABLET | ORAL | Status: DC | PRN

## 2018-05-20 MED ORDER — DOCUSATE SODIUM 100 MG PO CAPS: 100.0000 mg | ORAL_CAPSULE | Freq: Two times a day (BID) | ORAL | Status: DC

## 2018-05-20 MED ORDER — ACETAMINOPHEN 10 MG/ML IV SOLN
1000.0000 mg | Freq: Four times a day (QID) | INTRAVENOUS | Status: AC
  Administered 2018-05-20 – 2018-05-21 (×3): 1000 mg via INTRAVENOUS
  Filled 2018-05-20 (×5): qty 100

## 2018-05-20 MED ORDER — PHENOL 1.4 % MT LIQD
1.0000 | OROMUCOSAL | Status: DC | PRN
  Filled 2018-05-20: qty 177

## 2018-05-20 MED ORDER — HYDROMORPHONE HCL 1 MG/ML IJ SOLN: 0.5000 mg | INTRAMUSCULAR | Status: DC | PRN

## 2018-05-20 MED ORDER — OXYCODONE HCL 5 MG PO TABS: 5.0000 mg | ORAL_TABLET | Freq: Once | ORAL | Status: DC | PRN

## 2018-05-20 MED ORDER — ACETAMINOPHEN 650 MG RE SUPP: 650.0000 mg | Freq: Four times a day (QID) | RECTAL | Status: DC | PRN

## 2018-05-20 MED ORDER — SENNOSIDES-DOCUSATE SODIUM 8.6-50 MG PO TABS
1.0000 | ORAL_TABLET | Freq: Two times a day (BID) | ORAL | Status: DC
  Administered 2018-05-20 – 2018-05-22 (×4): 1 via ORAL
  Filled 2018-05-20 (×4): qty 1

## 2018-05-20 MED ORDER — ACETAMINOPHEN 325 MG PO TABS
325.0000 mg | ORAL_TABLET | Freq: Four times a day (QID) | ORAL | Status: DC | PRN
  Administered 2018-05-21 – 2018-05-22 (×2): 650 mg via ORAL
  Filled 2018-05-20 (×2): qty 2

## 2018-05-20 MED ORDER — METOCLOPRAMIDE HCL 5 MG PO TABS
10.0000 mg | ORAL_TABLET | Freq: Three times a day (TID) | ORAL | Status: DC
  Administered 2018-05-20 – 2018-05-22 (×5): 10 mg via ORAL
  Filled 2018-05-20 (×5): qty 2

## 2018-05-20 MED ORDER — QUETIAPINE FUMARATE 25 MG PO TABS
25.0000 mg | ORAL_TABLET | Freq: Every day | ORAL | Status: DC
  Administered 2018-05-20 – 2018-05-21 (×2): 25 mg via ORAL
  Filled 2018-05-20 (×3): qty 1

## 2018-05-20 SURGICAL SUPPLY — 37 items
BIT DRILL 4.2 (DRILL) ×1 IMPLANT
BLADE 85 HELICAL TFNA (Anchor) ×3 IMPLANT
CANISTER SUCT 1200ML W/VALVE (MISCELLANEOUS) ×3 IMPLANT
COVER WAND RF STERILE (DRAPES) ×3 IMPLANT
DRAPE C-ARMOR (DRAPES) ×3 IMPLANT
DRAPE SHEET LG 3/4 BI-LAMINATE (DRAPES) ×3 IMPLANT
DRILL 4.2 (DRILL) ×3
DRSG DERMACEA 8X12 NADH (GAUZE/BANDAGES/DRESSINGS) ×3 IMPLANT
DRSG OPSITE POSTOP 3X4 (GAUZE/BANDAGES/DRESSINGS) ×3 IMPLANT
DRSG OPSITE POSTOP 4X12 (GAUZE/BANDAGES/DRESSINGS) ×3 IMPLANT
DURAPREP 26ML APPLICATOR (WOUND CARE) ×3 IMPLANT
ELECT REM PT RETURN 9FT ADLT (ELECTROSURGICAL) ×3
ELECTRODE REM PT RTRN 9FT ADLT (ELECTROSURGICAL) ×1 IMPLANT
GAUZE SPONGE 4X4 12PLY STRL (GAUZE/BANDAGES/DRESSINGS) ×3 IMPLANT
GLOVE BIOGEL M STRL SZ7.5 (GLOVE) ×3 IMPLANT
GLOVE INDICATOR 8.0 STRL GRN (GLOVE) ×3 IMPLANT
GOWN STRL REUS W/ TWL LRG LVL3 (GOWN DISPOSABLE) ×2 IMPLANT
GOWN STRL REUS W/TWL LRG LVL3 (GOWN DISPOSABLE) ×4
KIT TURNOVER CYSTO (KITS) ×3 IMPLANT
MAT ABSORB  FLUID 56X50 GRAY (MISCELLANEOUS) ×2
MAT ABSORB FLUID 56X50 GRAY (MISCELLANEOUS) ×1 IMPLANT
NAIL TROCH FIX LNG 11X380RT (Nail) ×3 IMPLANT
NS IRRIG 1000ML POUR BTL (IV SOLUTION) ×3 IMPLANT
PACK HIP COMPR (MISCELLANEOUS) ×3 IMPLANT
PUTTY DBX 5CC (Putty) ×3 IMPLANT
REAMER ROD DEEP FLUTE 2.5X950 (INSTRUMENTS) ×3 IMPLANT
SCREW LOCKING 5.0X48MM (Screw) ×3 IMPLANT
SOL PREP PVP 2OZ (MISCELLANEOUS) ×3
SOLUTION PREP PVP 2OZ (MISCELLANEOUS) ×1 IMPLANT
STAPLER SKIN PROX 35W (STAPLE) ×3 IMPLANT
SUCTION FRAZIER HANDLE 10FR (MISCELLANEOUS) ×2
SUCTION TUBE FRAZIER 10FR DISP (MISCELLANEOUS) ×1 IMPLANT
SUT VIC AB 0 CT1 36 (SUTURE) ×3 IMPLANT
SUT VIC AB 1 CT1 36 (SUTURE) ×3 IMPLANT
SUT VIC AB 2-0 CT1 27 (SUTURE) ×2
SUT VIC AB 2-0 CT1 TAPERPNT 27 (SUTURE) ×1 IMPLANT
TAPE MICROFOAM 4IN (TAPE) ×3 IMPLANT

## 2018-05-20 NOTE — ED Notes (Addendum)
ED TO INPATIENT HANDOFF REPORT  ED Nurse Name and Phone #:  Britney Captain, RN [phone]  S Name/Age/Gender Katie Fitzgerald 78 y.o. female Room/Bed: ED14A/ED14A  Code Status   Code Status: Full Code  Home/SNF/Other Home Patient oriented to: self Is this baseline? Yes   Triage Complete: Triage complete  Chief Complaint Fall  Triage Note Pt arrived to the ED via EMS from home for complaints of right hip pain secondary to a mechanical fall caused by "tripping over her dog." Pt denies LOC or hitting her head. Pt is alert with advanced dementia.    Allergies Allergies  Allergen Reactions  . Amoxicillin     Other reaction(s): Unknown  . Penicillin G     Other reaction(s): Unknown    Level of Care/Admitting Diagnosis ED Disposition    ED Disposition Condition Comment   Admit  Hospital [facility]: [facility] [[id number]]  Level of Care: Med-Surg [16]  Diagnosis: Hip fracture [facility](HCC) [[id number]]  Admitting Physician: Hannah BeatMANSY, JAN A [[id number]]  Attending Physician: Hannah BeatMANSY, JAN A [[id number]]  Estimated length of stay: 3 - 4 days  Certification:: I certify this patient will need inpatient services for at least 2 midnights  Possible Covid Disease Patient Isolation: N/A  PT Class (Do Not Modify): Inpatient [101]  PT Acc Code (Do Not Modify): Private [1]       B Medical/Surgery History Past Medical History:  Diagnosis Date  . Dementia (HCC)   . Hypertension   . Iron deficiency anemia   . Osteoarthrosis    bilateral knees  . Osteoporosis    Past Surgical History:  Procedure Laterality Date  . HIP PINNING,CANNULATED Right 03/23/2017   Procedure: CANNULATED HIP PINNING;  Surgeon: Juanell FairlyKrasinski, Kevin, MD;  Location: [facility] ORS;  Service: Orthopedics;  Laterality: Right;  . INTRAMEDULLARY (IM) NAIL INTERTROCHANTERIC Left 09/09/2017   Procedure: INTRAMEDULLARY (IM) NAIL INTERTROCHANTRIC;  Surgeon: Kennedy BuckerMenz, Michael, MD;  Location: [facility] ORS;  Service: Orthopedics;  Laterality: Left;  . KNEE  SURGERY    . Left knee arthroscopy  07/17/2004  . Open reduction internal fixation of the left olecranon fracture Left 01/18/2014  . POSTERIOR LUMBAR SPINE FUSION ONE LEVEL  10/02/2010   L4-5  . Right knee arthroscopy  07/12/1999     A IV Location/Drains/Wounds Patient Lines/Drains/Airways Status   Active Line/Drains/Airways    Name:   Placement date:   Placement time:   Site:   Days:   Peripheral IV 05/20/18 Left Forearm   05/20/18    -    Forearm   less than 1   External Urinary Catheter   09/09/17    0053    -   253   Incision (Closed) 03/23/17 Hip Right   03/23/17    1437     423   Incision (Closed) 09/09/17 Hip Left   09/09/17    1246     253          Intake/Output Last 24 hours No intake or output data in the 24 hours ending 05/20/18 [id number]  Labs/Imaging Results for orders placed or performed during the hospital encounter of 05/19/18 (from the past 48 hour(s))  CBC with Differential/Platelet     Status: Abnormal   Collection Time: 05/20/18  2:26 AM  Result Value Ref Range   WBC 12.4 (H) 4.0 - 10.5 K/uL   RBC 3.72 (L) 3.87 - 5.11 MIL/uL   Hemoglobin 11.4 (L) 12.0 - 15.0 g/dL   HCT 66.435.7 (L) 40.336.0 - 47.446.0 %  MCV 96.0 80.0 - 100.0 fL   MCH 30.6 26.0 - 34.0 pg   MCHC 31.9 30.0 - 36.0 g/dL   RDW 60.1 09.3 - 23.5 %   Platelets 282 150 - 400 K/uL   nRBC 0.0 0.0 - 0.2 %   Neutrophils Relative % 74 %   Neutro Abs 9.2 (H) 1.7 - 7.7 K/uL   Lymphocytes Relative 16 %   Lymphs Abs 2.0 0.7 - 4.0 K/uL   Monocytes Relative 7 %   Monocytes Absolute 0.9 0.1 - 1.0 K/uL   Eosinophils Relative 1 %   Eosinophils Absolute 0.1 0.0 - 0.5 K/uL   Basophils Relative 1 %   Basophils Absolute 0.1 0.0 - 0.1 K/uL   Immature Granulocytes 1 %   Abs Immature Granulocytes 0.06 0.00 - 0.07 K/uL    Comment: Performed at [facility], [address] [place], [place] [address]  Basic metabolic panel     Status: Abnormal   Collection Time: 05/20/18  2:26 AM  Result Value Ref Range   Sodium  137 135 - 145 mmol/L   Potassium 3.9 3.5 - 5.1 mmol/L   Chloride 105 98 - 111 mmol/L   CO2 24 22 - 32 mmol/L   Glucose, Bld 112 (H) 70 - 99 mg/dL   BUN 26 (H) 8 - 23 mg/dL   Creatinine, Ser 0.25 0.44 - 1.00 mg/dL   Calcium 9.1 8.9 - 42.7 mg/dL   GFR calc non Af Amer >60 >60 mL/min   GFR calc Af Amer >60 >60 mL/min   Anion gap 8 5 - 15    Comment: Performed at [facility], [address] [place], [place] [address]   Ct Pelvis Wo Contrast  Result Date: 05/20/2018 CLINICAL DATA:  Recent fall with right hip pain, initial encounter EXAM: CT PELVIS WITHOUT CONTRAST TECHNIQUE: Multidetector CT imaging of the pelvis was performed following the standard protocol without intravenous contrast. COMPARISON:  Plain film from earlier in the same day. FINDINGS: Urinary Tract:  Bladder is well distended. Bowel: Scattered diverticular changes noted without evidence of diverticulitis. No obstructive changes are seen. Vascular/Lymphatic: Vascular calcifications are noted. No lymphadenopathy is seen. Reproductive:  No mass or other significant abnormality Other:  No free fluid is noted. Musculoskeletal: Postsurgical changes are noted in the proximal femurs bilaterally. Old healed left inferior pubic ramus fracture is noted. Postsurgical changes in the lower lumbar spine are noted. There are changes consistent with acute intratrochanteric fracture without significant displacement. No muscular hematoma is seen. No joint effusion is noted. IMPRESSION: Acute undisplaced intratrochanteric fracture on the right. Chronic changes as described above. Electronically Signed   By: Alcide Clever M.D.   On: 05/20/2018 00:44   Dg Chest Portable 1 View  Result Date: 05/20/2018 CLINICAL DATA:  78 y/o  F; preop for hip fracture. EXAM: PORTABLE CHEST 1 VIEW COMPARISON:  09/08/2017 chest radiograph FINDINGS: Stable cardiac silhouette within normal limits given projection and technique. Aortic atherosclerosis with  calcification. Mild biapical pleuroparenchymal scarring. No focal consolidation. No pleural effusion or pneumothorax. No acute osseous abnormality is evident. IMPRESSION: No acute pulmonary process identified. Electronically Signed   By: Mitzi Hansen M.D.   On: 05/20/2018 01:55   Dg Humerus Right  Result Date: 05/19/2018 CLINICAL DATA:  Right arm pain following fall, initial encounter EXAM: RIGHT HUMERUS - 2+ VIEW COMPARISON:  None. FINDINGS: There is no evidence of fracture or other focal bone lesions. Soft tissues are unremarkable. IMPRESSION: No acute abnormality noted. Electronically  Signed   By: Alcide Clever M.D.   On: 05/19/2018 22:33   Dg Hip Unilat W Or Wo Pelvis 2-3 Views Right  Result Date: 05/19/2018 CLINICAL DATA:  Tripped over dog with right hip pain, initial encounter EXAM: DG HIP (WITH OR WITHOUT PELVIS) 2-3V RIGHT COMPARISON:  03/23/2017 FINDINGS: Postsurgical changes are noted in the proximal femurs bilaterally. The pelvic ring is intact. Old healed fractures are noted. Postsurgical changes in the lower lumbar spine are seen. No acute fracture or hardware failure is noted. IMPRESSION: Chronic postsurgical and posttraumatic changes. No acute abnormality noted. Electronically Signed   By: Alcide Clever M.D.   On: 05/19/2018 22:32   Dg Femur Min 2 Views Right  Result Date: 05/20/2018 CLINICAL DATA:  78 y/o  F; right leg and thigh pain. EXAM: RIGHT FEMUR 2 VIEWS COMPARISON:  05/19/2018 pelvis and right hip radiographs. FINDINGS: No acute fracture or dislocation identified. Postsurgical changes of the right proximal femur with pinning of femoral neck. Severe tricompartmental osteoarthrosis of the knee joint with loss of the joint space and periarticular osteophytes. IMPRESSION: No acute fracture or dislocation identified. 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 Electronically Signed   By: Mitzi Hansen M.D.   On: 05/20/2018 00:29    Pending  Labs Unresulted Labs (From admission, onward)    Start     Ordered   05/20/18 0500  Basic metabolic panel  Tomorrow morning,   STAT     05/20/18 0229   05/20/18 0500  CBC  Tomorrow morning,   STAT     05/20/18 0229          Vitals/Pain Today's Vitals   05/20/18 0215 05/20/18 0230 05/20/18 0245 05/20/18 0300  BP:  122/64  (!) 146/67  Pulse:      Resp: 19 (!) 21 16 (!) 23  Temp:      TempSrc:      SpO2:      Weight:      Height:      PainSc:        Isolation Precautions No active isolations  Medications Medications  clindamycin (CLEOCIN) IVPB 600 mg (has no administration in time range)  HYDROcodone-acetaminophen (NORCO/VICODIN) 5-325 MG per tablet 1 tablet (has no administration in time range)  citalopram (CELEXA) tablet 20 mg (has no administration in time range)  memantine (NAMENDA) tablet 10 mg (has no administration in time range)  traZODone (DESYREL) tablet 25 mg (has no administration in time range)  docusate sodium (COLACE) capsule 100 mg (has no administration in time range)  polyethylene glycol (MIRALAX / GLYCOLAX) packet 17 g (has no administration in time range)  senna (SENOKOT) tablet 8.6 mg (has no administration in time range)  ferrous fumarate-b12-vitamic C-folic acid (TRINSICON / FOLTRIN) capsule 1 capsule (has no administration in time range)  gabapentin (NEURONTIN) tablet 300 mg (has no administration in time range)  potassium chloride SA (K-DUR,KLOR-CON) CR tablet 20 mEq (has no administration in time range)  0.9 %  sodium chloride infusion (has no administration in time range)  acetaminophen (TYLENOL) tablet 650 mg (has no administration in time range)    Or  acetaminophen (TYLENOL) suppository 650 mg (has no administration in time range)  magnesium hydroxide (MILK OF MAGNESIA) suspension 30 mL (has no administration in time range)  ondansetron (ZOFRAN) tablet 4 mg (has no administration in time range)    Or  ondansetron (ZOFRAN) injection 4 mg (has  no administration in time range)  morphine 2 MG/ML injection 2  mg (has no administration in time range)  fentaNYL (SUBLIMAZE) injection 25 mcg (25 mcg Intravenous Given 05/20/18 0038)    Mobility walks with device High fall risk    Focused Assessments  +2 pedal pulses bilateral -Limited movement of the right leg -no pedal edema    R Recommendations: See Admitting Provider Note  Report given to: Madelaine Bhat, RN   Additional Notes:

## 2018-05-20 NOTE — OR Nursing (Signed)
3 screws were removed from patient's right hip by Dr. Ernest Pine. Screws were placed in biohazard bin per Dr. Ernest Pine

## 2018-05-20 NOTE — ED Notes (Signed)
Dr. Mansy at the bedside. 

## 2018-05-20 NOTE — Transfer of Care (Signed)
Immediate Anesthesia Transfer of Care Note  Patient: Katie Fitzgerald  Procedure(s) Performed: INTRAMEDULLARY (IM) NAIL INTERTROCHANTRIC (Right )  Patient Location: PACU  Anesthesia Type:Spinal  Level of Consciousness: awake  Airway & Oxygen Therapy: Patient Spontanous Breathing and Patient connected to face mask oxygen  Post-op Assessment: Report given to RN and Post -op Vital signs reviewed and stable  Post vital signs: Reviewed  Last Vitals:  Vitals Value Taken Time  BP 111/53 05/20/2018  4:56 PM  Temp    Pulse 59 05/20/2018  4:56 PM  Resp 15 05/20/2018  4:56 PM  SpO2 100 % 05/20/2018  4:56 PM  Vitals shown include unvalidated device data.  Last Pain:  Vitals:   05/20/18 1228  TempSrc: Temporal  PainSc: 3       Patients Stated Pain Goal: 1 (05/20/18 0600)  Complications: No apparent anesthesia complications

## 2018-05-20 NOTE — Op Note (Signed)
OPERATIVE NOTE  DATE OF SURGERY:  05/20/2018  PATIENT NAME:  Katie Fitzgerald   DOB: 03-31-1940  MRN: [id number]  PRE-OPERATIVE DIAGNOSIS: Right intertrochanteric femur fracture  Retained cannulated screw status post percutaneous pinning of a right femoral neck fracture  POST-OPERATIVE DIAGNOSIS:  Same  PROCEDURE:  Open reduction and internal fixation of a right intertrochanteric femur fracture Removal of 3 retained cannulated 6.5 millimeter screws  SURGEON:  Donato Heinz, Jr. M.D.  ANESTHESIA: spinal  ESTIMATED BLOOD LOSS: 100 mL  FLUIDS REPLACED: 700 mL of crystalloid  DRAINS: None  IMPLANTS UTILIZED: Synthes 11 mm x 380 mm/130 trochanteric fixation nail, 85 mm helical blade, 48 mm x 5.0 mm locking screw: 5 cc of DBX bone putty  INDICATIONS FOR SURGERY: Katie Fitzgerald is a 78 y.o. year old female who fell and sustained a displaced right intertrochanteric femur fracture.  She had previously undergone percutaneous pinning of a right femoral neck fracture approximately 1 year ago.  After discussion of the risks and benefits of surgical intervention, the patient expressed understanding of the risks benefits and agree with plans for open reduction and internal fixation after removal of the 3 cannulated screws..   The risks, benefits, and alternatives were discussed at length including but not limited to the risks of infection, bleeding, nerve injury, stiffness, blood clots, the need for revision surgery, limb length inequality, cardiopulmonary complications, among others, and they were willing to proceed.  PROCEDURE IN DETAIL: The patient was brought into the operating room and, after adequate spinal anesthesia was achieved, patient was placed on the fracture table. All bony prominences were well-padded. The right lower extremity was placed in traction and a provisional reduction was performed and verified using the C-arm. The patient's right hip and leg were cleaned and prepped  with alcohol and DuraPrep and draped in the usual sterile fashion. A "timeout" was performed as per usual protocol.  A lateral incision was made in line with the previous surgical scar from the percutaneous pinning.  The 3 screw heads were localized and a guidepin was inserted so as to allow for removal of the three 6.5 mm cannulated screws.  The screws were removed without difficulty.  Next, a lateral incision was made extended from the proximal portion of the greater trochanter proximally. The fascia was incised in line with the skin incision and the fibers of the hip abductors were split in line. The tip of the greater trochanter was palpated and a distally threaded guide pin was inserted into the tip of the greater trochanter and advanced into the medullary canal. Position was confirmed in both AP and lateral planes using the C-arm. A pilot hole was enlarged using a step drill.  A distally beaded guidewire replaced the initial guidepin and was advanced down the medullary canal to the level just superior to the patella.  Measurements were obtained and it was felt that a  380 mm nail was appropriate.  The femoral canal was reamed in a sequential fashion up to a 12 mm diameter.  A Synthes 11 mm x 380 mm/130 trochanteric fixation nail was advanced over the guidepin and position confirmed using the C-arm. A second stab incision was made and the tissue protector was inserted through the outrigger device and advanced to the lateral cortex of the femur. A threaded screw guide pin was inserted into the femoral neck and head and position was again confirmed in both AP and lateral planes. Measurements were obtained and it was felt that an  85 mm helical blade was appropriate. A cannulated reamer was advanced over the guidepin to the appropriate depth. An 85 mm helical blade was then advanced over the guidepin and impacted into place. Good position was noted in multiple planes using the C-arm. The locking sleeve was  engaged. Finally, the C arm was positioned supposed to visualize the distal locking holes of the nail.  A third stab incision was made and a hole was drilled for for placement of a distal locking screw in the oblong hole. A 48 mm x 5.0 mm locking screw was then inserted. The outrigger device was removed. The hip was visualized in all planes using the C-arm with good reduction appreciated and good position of the hardware noted.  The wound was irrigated with copious amounts of normal saline with antibiotic solution and suctioned dry. Good hemostasis was appreciated. The fascia was reapproximated using interrupted sutures of #1 Vicryl. Subcutaneous tissue was approximated layers using first #0 Vicryl followed #2-0 Vicryl. The skin was closed with skin staples. A sterile dressing was applied.  The patient tolerated the procedure well and was transported to the recovery room in stable condition.   Katie Fitzgerald , Jr., M.D.

## 2018-05-20 NOTE — H&P (Signed)
[facility] -  at [facility]   PATIENT NAME: Katie Fitzgerald    MR#:  [id number]  DATE OF BIRTH:  11/08/1940  DATE OF ADMISSION:  05/19/2018  PRIMARY CARE PHYSICIAN: Patient, No Pcp Per   REQUESTING/REFERRING PHYSICIAN: Sharman Cheek, MD  CHIEF COMPLAINT:   Chief Complaint  Patient presents with  . Fall  . Hip Pain    HISTORY OF PRESENT ILLNESS:  Katie Fitzgerald  is a 78 y.o. female with a known history of hypertension, dementia and osteoporosis, who presented to the emergency room with acute onset of right hip pain after having a mechanical accidental fall when she lost her balance after her 55 pounds dog got behind her feet causing her to trip and fall onto the right side with subsequent right hip pain.  He denies any headache or dizziness or blurred vision, chest pain or dyspnea or palpitations.  No paresthesias or focal muscle weakness.  Upon presentation to the emergency room vital signs were within normal.  Labs were remarkable for a BUN of 26.  She had a right humerus and femur x-ray that came back negative for fracture but showed tricompartmental right knee osteoarthritis.  Portable chest ray showed no acute cardiopulmonary disease.  CT of the pelvis without contrast revealed acute undisplaced intertrochanteric right femoral fracture.  Dr. Ernest Pine was consulted about the patient and is aware.  The patient was given fentanyl 25 mcg IV as well as 600 mg of IV clindamycin.  The patient will be admitted to a surgical bed for further evaluation and management.  PAST MEDICAL HISTORY:   Past Medical History:  Diagnosis Date  . Dementia (HCC)   . Hypertension   . Iron deficiency anemia   . Osteoarthrosis    bilateral knees  . Osteoporosis     PAST SURGICAL HISTORY:   Past Surgical History:  Procedure Laterality Date  . HIP PINNING,CANNULATED Right 03/23/2017   Procedure: CANNULATED HIP PINNING;  Surgeon: Juanell Fairly, MD;  Location: [facility]  ORS;  Service: Orthopedics;  Laterality: Right;  . INTRAMEDULLARY (IM) NAIL INTERTROCHANTERIC Left 09/09/2017   Procedure: INTRAMEDULLARY (IM) NAIL INTERTROCHANTRIC;  Surgeon: Kennedy Bucker, MD;  Location: [facility] ORS;  Service: Orthopedics;  Laterality: Left;  . KNEE SURGERY    . Left knee arthroscopy  07/17/2004  . Open reduction internal fixation of the left olecranon fracture Left 01/18/2014  . POSTERIOR LUMBAR SPINE FUSION ONE LEVEL  10/02/2010   L4-5  . Right knee arthroscopy  07/12/1999    SOCIAL HISTORY:   Social History   Tobacco Use  . Smoking status: Never Smoker  . Smokeless tobacco: Never Used  Substance Use Topics  . Alcohol use: No    FAMILY HISTORY:   Family History  Problem Relation Age of Onset  . Dementia Mother   . Stroke Father     DRUG ALLERGIES:   Allergies  Allergen Reactions  . Amoxicillin     Other reaction(s): Unknown  . Penicillin G     Other reaction(s): Unknown    REVIEW OF SYSTEMS:   ROS As per history of present illness. All pertinent systems were reviewed above. Constitutional,  HEENT, cardiovascular, respiratory, GI, GU, musculoskeletal, neuro, psychiatric, endocrine,  integumentary and hematologic systems were reviewed and are otherwise  negative/unremarkable except for positive findings mentioned above in the HPI.   MEDICATIONS AT HOME:   Prior to Admission medications   Medication Sig Start Date End Date Taking? Authorizing Provider  acetaminophen (TYLENOL) 325 MG tablet  Take 2 tablets (650 mg total) by mouth every 6 (six) hours as needed for mild pain (or Fever >/= 101). 03/25/17   Altamese DillingVachhani, Vaibhavkumar, MD  citalopram (CELEXA) 20 MG tablet Take 1 tablet (20 mg total) by mouth daily. 03/25/17   Altamese DillingVachhani, Vaibhavkumar, MD  docusate sodium (COLACE) 100 MG capsule Take 1 capsule (100 mg total) by mouth 2 (two) times daily. 03/25/17   Altamese DillingVachhani, Vaibhavkumar, MD  enoxaparin (LOVENOX) 40 MG/0.4ML injection Inject 0.4 mLs (40 mg total)  into the skin daily. 09/11/17   Alford HighlandWieting, Richard, MD  feeding supplement, ENSURE ENLIVE, (ENSURE ENLIVE) LIQD Take 237 mLs by mouth 2 (two) times daily between meals. 09/11/17   Alford HighlandWieting, Richard, MD  ferrous fumarate-b12-vitamic C-folic acid (TRINSICON / FOLTRIN) capsule Take 1 capsule by mouth 2 (two) times daily after a meal. 09/11/17   Alford HighlandWieting, Richard, MD  gabapentin (NEURONTIN) 600 MG tablet Take 0.5 tablets (300 mg total) by mouth 2 (two) times daily. 03/25/17   Altamese DillingVachhani, Vaibhavkumar, MD  HYDROcodone-acetaminophen (NORCO/VICODIN) 5-325 MG tablet Take 1 tablet by mouth every 6 (six) hours as needed for moderate pain. 09/11/17   Alford HighlandWieting, Richard, MD  memantine (NAMENDA) 10 MG tablet Take 10 mg by mouth 2 (two) times daily. 04/15/15   [provider]  polyethylene glycol (MIRALAX / GLYCOLAX) packet Take 17 g by mouth daily as needed for mild constipation. 03/25/17   Altamese DillingVachhani, Vaibhavkumar, MD  potassium chloride SA (K-DUR,KLOR-CON) 20 MEQ tablet Take 1 tablet (20 mEq total) by mouth daily. 03/25/17   Altamese DillingVachhani, Vaibhavkumar, MD  senna (SENOKOT) 8.6 MG TABS tablet Take 1 tablet (8.6 mg total) by mouth 2 (two) times daily. 03/25/17   Altamese DillingVachhani, Vaibhavkumar, MD  traZODone (DESYREL) 50 MG tablet Take 0.5 tablets (25 mg total) by mouth at bedtime as needed for sleep. 09/11/17   Alford HighlandWieting, Richard, MD      VITAL SIGNS:  Blood pressure 112/76, pulse 76, temperature 98.2 F (36.8 C), temperature source Oral, resp. rate 14, height 5\' 6"  (1.676 m), weight 51 kg, SpO2 100 %.  PHYSICAL EXAMINATION:  Physical Exam  GENERAL:  78 y.o.-year-old Caucasian female patient lying in the bed with no acute distress.  EYES: Pupils equal, round, reactive to light and accommodation. No scleral icterus. Extraocular muscles intact.  HEENT: Head atraumatic, normocephalic. Oropharynx and nasopharynx clear.  NECK:  Supple, no jugular venous distention. No thyroid enlargement, no tenderness.  LUNGS: Normal breath sounds  bilaterally, no wheezing, rales,rhonchi or crepitation. No use of accessory muscles of respiration.  CARDIOVASCULAR: Regular rate and rhythm, S1, S2 normal. No murmurs, rubs, or gallops.  ABDOMEN: Soft, nondistended, nontender. Bowel sounds present. No organomegaly or mass.  EXTREMITIES: No pedal edema, cyanosis, or clubbing.  NEUROLOGIC: Cranial nerves II through XII are intact. Muscle strength 5/5 in all extremities. Sensation intact. Gait not checked. Musculoskeletal: Right hip tenderness PSYCHIATRIC: The patient is alert and oriented x 3.  Normal affect and good eye contact. SKIN: No obvious rash, lesion, or ulcer.   LABORATORY PANEL:   CBC Recent Labs  Lab 05/20/18 0226  WBC 12.4*  HGB 11.4*  HCT 35.7*  PLT 282   ------------------------------------------------------------------------------------------------------------------  Chemistries  Recent Labs  Lab 05/20/18 0226  NA 137  K 3.9  CL 105  CO2 24  GLUCOSE 112*  BUN 26*  CREATININE 0.92  CALCIUM 9.1   ------------------------------------------------------------------------------------------------------------------  Cardiac Enzymes No results for input(s): TROPONINI in the last 168 hours. ------------------------------------------------------------------------------------------------------------------  RADIOLOGY:  Ct Pelvis Wo Contrast  Result Date:  05/20/2018 CLINICAL DATA:  Recent fall with right hip pain, initial encounter EXAM: CT PELVIS WITHOUT CONTRAST TECHNIQUE: Multidetector CT imaging of the pelvis was performed following the standard protocol without intravenous contrast. COMPARISON:  Plain film from earlier in the same day. FINDINGS: Urinary Tract:  Bladder is well distended. Bowel: Scattered diverticular changes noted without evidence of diverticulitis. No obstructive changes are seen. Vascular/Lymphatic: Vascular calcifications are noted. No lymphadenopathy is seen. Reproductive:  No mass or other  significant abnormality Other:  No free fluid is noted. Musculoskeletal: Postsurgical changes are noted in the proximal femurs bilaterally. Old healed left inferior pubic ramus fracture is noted. Postsurgical changes in the lower lumbar spine are noted. There are changes consistent with acute intratrochanteric fracture without significant displacement. No muscular hematoma is seen. No joint effusion is noted. IMPRESSION: Acute undisplaced intratrochanteric fracture on the right. Chronic changes as described above. Electronically Signed   By: Alcide Clever M.D.   On: 05/20/2018 00:44   Dg Chest Portable 1 View  Result Date: 05/20/2018 CLINICAL DATA:  78 y/o  F; preop for hip fracture. EXAM: PORTABLE CHEST 1 VIEW COMPARISON:  09/08/2017 chest radiograph FINDINGS: Stable cardiac silhouette within normal limits given projection and technique. Aortic atherosclerosis with calcification. Mild biapical pleuroparenchymal scarring. No focal consolidation. No pleural effusion or pneumothorax. No acute osseous abnormality is evident. IMPRESSION: No acute pulmonary process identified. Electronically Signed   By: Mitzi Hansen M.D.   On: 05/20/2018 01:55   Dg Humerus Right  Result Date: 05/19/2018 CLINICAL DATA:  Right arm pain following fall, initial encounter EXAM: RIGHT HUMERUS - 2+ VIEW COMPARISON:  None. FINDINGS: There is no evidence of fracture or other focal bone lesions. Soft tissues are unremarkable. IMPRESSION: No acute abnormality noted. Electronically Signed   By: Alcide Clever M.D.   On: 05/19/2018 22:33   Dg Hip Unilat W Or Wo Pelvis 2-3 Views Right  Result Date: 05/19/2018 CLINICAL DATA:  Tripped over dog with right hip pain, initial encounter EXAM: DG HIP (WITH OR WITHOUT PELVIS) 2-3V RIGHT COMPARISON:  03/23/2017 FINDINGS: Postsurgical changes are noted in the proximal femurs bilaterally. The pelvic ring is intact. Old healed fractures are noted. Postsurgical changes in the lower lumbar  spine are seen. No acute fracture or hardware failure is noted. IMPRESSION: Chronic postsurgical and posttraumatic changes. No acute abnormality noted. Electronically Signed   By: Alcide Clever M.D.   On: 05/19/2018 22:32   Dg Femur Min 2 Views Right  Result Date: 05/20/2018 CLINICAL DATA:  78 y/o  F; right leg and thigh pain. EXAM: RIGHT FEMUR 2 VIEWS COMPARISON:  05/19/2018 pelvis and right hip radiographs. FINDINGS: No acute fracture or dislocation identified. Postsurgical changes of the right proximal femur with pinning of femoral neck. Severe tricompartmental osteoarthrosis of the knee joint with loss of the joint space and periarticular osteophytes. IMPRESSION: No acute fracture or dislocation identified. 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 Electronically Signed   By: Mitzi Hansen M.D.   On: 05/20/2018 00:29      IMPRESSION AND PLAN:   #1.  Right hip fracture secondary to mechanical fall.  The patient will be admitted to surgical bed.  Pain management will be provided.  Orthopedic consultation was obtained by Dr. Ernest Pine.  I notified him about the patient.  She has no history of CVA, diabetes mellitus on insulin, coronary artery disease, CHF or renal failure.  She is considered at average risk for her age.  The revised cardiac risk index.  She has no current pulmonary issues.  2.  Dementia.  Aricept and Namenda will be resumed.  3.  Depression.  Celexa, Seroquel and trazodone will be continued.  4.  Chronic anemia.  Her iron supplement and multivitamin will be continued.  5.  Hypertension.  This is apparently diet managed.  She will be placed on PRN IV labetalol.  6.  DVT prophylaxis.  SCDs for now.  Subcutaneous Lovenox can be utilized postoperatively.  All the records are reviewed and case discussed with ED provider. The plan of care was discussed in details with the patient (and family). I answered all questions. The patient agreed to proceed with the  above mentioned plan. Further management will depend upon hospital course.   CODE STATUS: Full code  TOTAL TIME TAKING CARE OF THIS PATIENT: 55 minutes.    Hannah Beat M.D on 05/20/2018 at 3:06 AM  Pager - [phone]  After 6pm go to [email or web address] - Social research officer, government  [facility]  Office  [phone]  CC: Primary care physician; Patient, No Pcp Per   Note: This dictation was prepared with Dragon dictation along with smaller phrase technology. Any transcriptional errors that result from this process are unintentional.

## 2018-05-20 NOTE — ED Notes (Signed)
Report was called and given to [facility], RN and he verbalized understanding of the information. The pt's daughter is at the bedside and will be going with the pt to the floor room.

## 2018-05-20 NOTE — Progress Notes (Signed)
#  1.  Right hip fracture secondary to mechanical fall.  The patient will be admitted to surgical bed.  Pain management will be provided.  Orthopedic consultation was obtained by Dr. Ernest Pine.  I notified him about the patient.  She has no history of CVA, diabetes mellitus on insulin, coronary artery disease, CHF or renal failure.  She is considered at average risk for her age.  The revised cardiac risk index.  She has no current pulmonary issues.  2.  Dementia.  Aricept and Namenda will be resumed.  3.  Depression.  Celexa, Seroquel and trazodone will be continued.  4.  Chronic anemia.  Her iron supplement and multivitamin will be continued.  5.  Hypertension.  This is apparently diet managed.  She will be placed on PRN IV labetalol.  6.  DVT prophylaxis.  SCDs for now.  Subcutaneous Lovenox can be utilized postoperatively.  Agree with above

## 2018-05-20 NOTE — Anesthesia Preprocedure Evaluation (Signed)
Anesthesia Evaluation  Patient identified by MRN, date of birth, ID band Patient awake and Patient confused    Reviewed: Allergy & Precautions, H&P , NPO status , Patient's Chart, lab work & pertinent test results  History of Anesthesia Complications Negative for: history of anesthetic complications  Airway Mallampati: III  TM Distance: <3 FB Neck ROM: limited    Dental  (+) Chipped, Poor Dentition, Missing   Pulmonary neg pulmonary ROS, neg shortness of breath, neg COPD,           Cardiovascular Exercise Tolerance: Good hypertension, (-) angina(-) Past MI      Neuro/Psych negative neurological ROS  negative psych ROS   GI/Hepatic negative GI ROS, Neg liver ROS, neg GERD  ,  Endo/Other  negative endocrine ROS  Renal/GU      Musculoskeletal  (+) Arthritis ,   Abdominal   Peds  Hematology negative hematology ROS (+)   Anesthesia Other Findings Past Medical History: No date: Dementia (HCC) No date: Hypertension No date: Iron deficiency anemia No date: Osteoarthrosis     Comment:  bilateral knees No date: Osteoporosis  Past Surgical History: 03/23/2017: HIP PINNING,CANNULATED; Right     Comment:  Procedure: CANNULATED HIP PINNING;  Surgeon: Juanell Fairly, MD;  Location: [facility] ORS;  Service: Orthopedics;                Laterality: Right; 09/09/2017: INTRAMEDULLARY (IM) NAIL INTERTROCHANTERIC; Left     Comment:  Procedure: INTRAMEDULLARY (IM) NAIL INTERTROCHANTRIC;                Surgeon: Kennedy Bucker, MD;  Location: [facility] ORS;                Service: Orthopedics;  Laterality: Left; No date: KNEE SURGERY 07/17/2004: Left knee arthroscopy 01/18/2014: Open reduction internal fixation of the left olecranon  fracture; Left 10/02/2010: POSTERIOR LUMBAR SPINE FUSION ONE LEVEL     Comment:  L4-5 07/12/1999: Right knee arthroscopy  BMI    Body Mass Index:  18.15 kg/m       Reproductive/Obstetrics negative OB ROS                             Anesthesia Physical Anesthesia Plan  ASA: II  Anesthesia Plan: Spinal   Post-op Pain Management:    Induction:   PONV Risk Score and Plan:   Airway Management Planned: Natural Airway and Nasal Cannula  Additional Equipment:   Intra-op Plan:   Post-operative Plan:   Informed Consent: I have reviewed the patients History and Physical, chart, labs and discussed the procedure including the risks, benefits and alternatives for the proposed anesthesia with the patient or authorized representative who has indicated his/her understanding and acceptance.     Dental Advisory Given  Plan Discussed with: Anesthesiologist, CRNA and Surgeon  Anesthesia Plan Comments: (Consent via daughter  Daughter reports no bleeding problems and no anticoagulant use (lovenox stopped last year per daughter report).  Plan for spinal with backup GA  Daughter consented for risks of anesthesia including but not limited to:  - adverse reactions to medications - risk of bleeding, infection, nerve damage and headache - risk of failed spinal - damage to teeth, lips or other oral mucosa - sore throat or hoarseness - Damage to heart, brain, lungs or loss of life  She voiced understanding.)  Anesthesia Quick Evaluation  

## 2018-05-20 NOTE — Consult Note (Signed)
ORTHOPAEDIC CONSULTATION  PATIENT NAME: Katie MainsDianne V Fitzgerald DOB: 08/28/1940  MRN: [id number]  REQUESTING PHYSICIAN: Salary, Evelena AsaMontell D, MD  Chief Complaint: Right hip pain  HPI: Katie Fitzgerald is a 78 y.o. female who apparently fell over her dog and landed on her right hip and side.  She had the onset of severe right hip pain and also complained of some right upper arm pain.  There was no loss of consciousness.  She denied any other injuries.  Prior to this injury she was typically ambulating independently, although she does have a walker.  The patient has significant dementia, and most of the details come from her daughter who is at bedside.  Past Medical History:  Diagnosis Date  . Dementia (HCC)   . Hypertension   . Iron deficiency anemia   . Osteoarthrosis    bilateral knees  . Osteoporosis    Past Surgical History:  Procedure Laterality Date  . HIP PINNING,CANNULATED Right 03/23/2017   Procedure: CANNULATED HIP PINNING;  Surgeon: Juanell FairlyKrasinski, Kevin, MD;  Location: [facility] ORS;  Service: Orthopedics;  Laterality: Right;  . INTRAMEDULLARY (IM) NAIL INTERTROCHANTERIC Left 09/09/2017   Procedure: INTRAMEDULLARY (IM) NAIL INTERTROCHANTRIC;  Surgeon: Kennedy BuckerMenz, Michael, MD;  Location: [facility] ORS;  Service: Orthopedics;  Laterality: Left;  . KNEE SURGERY    . Left knee arthroscopy  07/17/2004  . Open reduction internal fixation of the left olecranon fracture Left 01/18/2014  . POSTERIOR LUMBAR SPINE FUSION ONE LEVEL  10/02/2010   L4-5  . Right knee arthroscopy  07/12/1999   Social History   Socioeconomic History  . Marital status: Divorced    Spouse name: Not on file  . Number of children: Not on file  . Years of education: Not on file  . Highest education level: Not on file  Occupational History  . Not on file  Social Needs  . Financial resource strain: Not on file  . Food insecurity:    Worry: Not on file    Inability: Not on file  . Transportation needs:    Medical: Not on file     Non-medical: Not on file  Tobacco Use  . Smoking status: Never Smoker  . Smokeless tobacco: Never Used  Substance and Sexual Activity  . Alcohol use: No  . Drug use: No  . Sexual activity: Not on file  Lifestyle  . Physical activity:    Days per week: Not on file    Minutes per session: Not on file  . Stress: Not on file  Relationships  . Social connections:    Talks on phone: Not on file    Gets together: Not on file    Attends religious service: Not on file    Active member of club or organization: Not on file    Attends meetings of clubs or organizations: Not on file    Relationship status: Not on file  Other Topics Concern  . Not on file  Social History Narrative  . Not on file   Family History  Problem Relation Age of Onset  . Dementia Mother   . Stroke Father    Allergies  Allergen Reactions  . Amoxicillin     Other reaction(s): Unknown  . Penicillin G     Other reaction(s): Unknown   Prior to Admission medications   Medication Sig Start Date End Date Taking? Authorizing Provider  acetaminophen (TYLENOL) 325 MG tablet Take 2 tablets (650 mg total) by mouth every 6 (six) hours as needed for mild pain (  or Fever >/= 101). 03/25/17   Altamese Dilling, MD  citalopram (CELEXA) 20 MG tablet Take 1 tablet (20 mg total) by mouth daily. 03/25/17   Altamese Dilling, MD  docusate sodium (COLACE) 100 MG capsule Take 1 capsule (100 mg total) by mouth 2 (two) times daily. 03/25/17   Altamese Dilling, MD  enoxaparin (LOVENOX) 40 MG/0.4ML injection Inject 0.4 mLs (40 mg total) into the skin daily. 09/11/17   Alford Highland, MD  feeding supplement, ENSURE ENLIVE, (ENSURE ENLIVE) LIQD Take 237 mLs by mouth 2 (two) times daily between meals. 09/11/17   Alford Highland, MD  ferrous fumarate-b12-vitamic C-folic acid (TRINSICON / FOLTRIN) capsule Take 1 capsule by mouth 2 (two) times daily after a meal. 09/11/17   Alford Highland, MD  gabapentin (NEURONTIN) 600 MG tablet  Take 0.5 tablets (300 mg total) by mouth 2 (two) times daily. 03/25/17   Altamese Dilling, MD  HYDROcodone-acetaminophen (NORCO/VICODIN) 5-325 MG tablet Take 1 tablet by mouth every 6 (six) hours as needed for moderate pain. 09/11/17   Alford Highland, MD  memantine (NAMENDA) 10 MG tablet Take 10 mg by mouth 2 (two) times daily. 04/15/15   [provider]  polyethylene glycol (MIRALAX / GLYCOLAX) packet Take 17 g by mouth daily as needed for mild constipation. 03/25/17   Altamese Dilling, MD  potassium chloride SA (K-DUR,KLOR-CON) 20 MEQ tablet Take 1 tablet (20 mEq total) by mouth daily. 03/25/17   Altamese Dilling, MD  senna (SENOKOT) 8.6 MG TABS tablet Take 1 tablet (8.6 mg total) by mouth 2 (two) times daily. 03/25/17   Altamese Dilling, MD  traZODone (DESYREL) 50 MG tablet Take 0.5 tablets (25 mg total) by mouth at bedtime as needed for sleep. 09/11/17   Alford Highland, MD   Ct Pelvis Wo Contrast  Result Date: 05/20/2018 CLINICAL DATA:  Recent fall with right hip pain, initial encounter EXAM: CT PELVIS WITHOUT CONTRAST TECHNIQUE: Multidetector CT imaging of the pelvis was performed following the standard protocol without intravenous contrast. COMPARISON:  Plain film from earlier in the same day. FINDINGS: Urinary Tract:  Bladder is well distended. Bowel: Scattered diverticular changes noted without evidence of diverticulitis. No obstructive changes are seen. Vascular/Lymphatic: Vascular calcifications are noted. No lymphadenopathy is seen. Reproductive:  No mass or other significant abnormality Other:  No free fluid is noted. Musculoskeletal: Postsurgical changes are noted in the proximal femurs bilaterally. Old healed left inferior pubic ramus fracture is noted. Postsurgical changes in the lower lumbar spine are noted. There are changes consistent with acute intratrochanteric fracture without significant displacement. No muscular hematoma is seen. No joint effusion is  noted. IMPRESSION: Acute undisplaced intratrochanteric fracture on the right. Chronic changes as described above. Electronically Signed   By: Alcide Clever M.D.   On: 05/20/2018 00:44   Dg Chest Portable 1 View  Result Date: 05/20/2018 CLINICAL DATA:  78 y/o  F; preop for hip fracture. EXAM: PORTABLE CHEST 1 VIEW COMPARISON:  09/08/2017 chest radiograph FINDINGS: Stable cardiac silhouette within normal limits given projection and technique. Aortic atherosclerosis with calcification. Mild biapical pleuroparenchymal scarring. No focal consolidation. No pleural effusion or pneumothorax. No acute osseous abnormality is evident. IMPRESSION: No acute pulmonary process identified. Electronically Signed   By: Mitzi Hansen M.D.   On: 05/20/2018 01:55   Dg Humerus Right  Result Date: 05/19/2018 CLINICAL DATA:  Right arm pain following fall, initial encounter EXAM: RIGHT HUMERUS - 2+ VIEW COMPARISON:  None. FINDINGS: There is no evidence of fracture or other focal bone lesions.  Soft tissues are unremarkable. IMPRESSION: No acute abnormality noted. Electronically Signed   By: Alcide Clever M.D.   On: 05/19/2018 22:33   Dg Hip Unilat W Or Wo Pelvis 2-3 Views Right  Result Date: 05/19/2018 CLINICAL DATA:  Tripped over dog with right hip pain, initial encounter EXAM: DG HIP (WITH OR WITHOUT PELVIS) 2-3V RIGHT COMPARISON:  03/23/2017 FINDINGS: Postsurgical changes are noted in the proximal femurs bilaterally. The pelvic ring is intact. Old healed fractures are noted. Postsurgical changes in the lower lumbar spine are seen. No acute fracture or hardware failure is noted. IMPRESSION: Chronic postsurgical and posttraumatic changes. No acute abnormality noted. Electronically Signed   By: Alcide Clever M.D.   On: 05/19/2018 22:32   Dg Femur Min 2 Views Right  Result Date: 05/20/2018 CLINICAL DATA:  78 y/o  F; right leg and thigh pain. EXAM: RIGHT FEMUR 2 VIEWS COMPARISON:  05/19/2018 pelvis and right hip  radiographs. FINDINGS: No acute fracture or dislocation identified. Postsurgical changes of the right proximal femur with pinning of femoral neck. Severe tricompartmental osteoarthrosis of the knee joint with loss of the joint space and periarticular osteophytes. IMPRESSION: No acute fracture or dislocation identified. 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 Electronically Signed   By: Mitzi Hansen M.D.   On: 05/20/2018 00:29   Positive ROS: All other systems have been reviewed and were otherwise negative with the exception of those mentioned in the HPI and as above.  Physical Exam: General: Well developed, well nourished female who was anxious and obviously uncomfortable.Marland Kitchen HEENT: Atraumatic and normocephalic. Sclera are clear. Extraocular motion is intact. Oropharynx is clear with moist mucosa. Neck: Supple, nontender, good range of motion. No JVD or carotid bruits. Lungs: Clear to auscultation bilaterally. Cardiovascular: Regular rate and rhythm with normal S1 and S2. No murmurs. No gallops or rubs. Pedal pulses are palpable bilaterally. Homans test is negative bilaterally. No significant pretibial or ankle edema. Abdomen: Soft, nontender, and nondistended. Bowel sounds are present. Skin: No lesions in the area of chief complaint.  There is a well-healed lateral incision to the upper right thigh. Neurologic: Awake, alert, but not oriented. Sensory function is grossly intact. Motor strength is felt to be 5 over 5 bilaterally. No clonus or tremor. Good motor coordination. Lymphatic: No axillary or cervical lymphadenopathy  MUSCULOSKELETAL: Semination of the right upper extremity demonstrates good range of motion of the shoulder and elbow.  No gross tenderness to palpation.  No localized swelling to the shoulder, upper arm, or elbow.  Good grip strength is noted.  Examination of the right lower extremity demonstrates obvious pain with any attempted range of motion of the  right hip.  No gross shortening.  There is some tenderness to palpation over the greater trochanter.  Gross tenderness about the knee.  No knee effusion.  No gross swelling or tenderness to the foot or ankle.  Assessment: Right intertrochanteric femur fracture Retained cannulated screws status post percutaneous pinning of a right femoral neck fracture  Plan: The findings were discussed in detail with the patient's daughter.  Recommendation was made for removal of the cannulated screws and placement of a trochanteric fixation nail. The usual perioperative course was discussed. The risks and benefits of surgical intervention were reviewed. The patient expressed understanding of the risks and benefits and agreed with plans for surgical intervention.   The surgical site was signed as per the "right site surgery" protocol.   Arma Reining P. Angie Fava M.D.

## 2018-05-20 NOTE — Anesthesia Post-op Follow-up Note (Signed)
Anesthesia QCDR form completed.        

## 2018-05-21 ENCOUNTER — Encounter: Payer: Self-pay | Admitting: Orthopedic Surgery

## 2018-05-21 MED ORDER — ENSURE ENLIVE PO LIQD
237.0000 mL | Freq: Two times a day (BID) | ORAL | Status: DC
  Administered 2018-05-21: 237 mL via ORAL

## 2018-05-21 NOTE — Evaluation (Signed)
Physical Therapy Evaluation Patient Details Name: Katie Fitzgerald MRN: [id number] DOB: April 30, 1940 Today's Date: 05/21/2018   History of Present Illness  78 y.o. female with a known history of hypertension, dementia and osteoporosis, who presented to the emergency room with acute onset of right hip pain after fall.  Pt has h/o multiple falls with multiple LE fx/surgical repairs.    Clinical Impression  Pt very confused t/o the session, however much more willing to participate with PT than first attempt.  Pt continues to be very confused and need essentially constant cuing and encouragement. Pt was able to do relatively well once she understood what we were doing but ultimately her confusion is her biggest liability at this time.      Follow Up Recommendations SNF    Equipment Recommendations  None recommended by PT    Recommendations for Other Services       Precautions / Restrictions Precautions Precautions: Fall Restrictions Weight Bearing Restrictions: Yes RLE Weight Bearing: Weight bearing as tolerated      Mobility  Bed Mobility Overal bed mobility: Needs Assistance Bed Mobility: Supine to Sit     Supine to sit: Min assist     General bed mobility comments: Pt was able to assist with getting to EOB, needed constant cuing but phyiscally did reasonably well  Transfers Overall transfer level: Needs assistance Equipment used: Rolling walker (2 wheeled) Transfers: Sit to/from Stand Sit to Stand: Min assist         General transfer comment: Pt needed constant VCs clarification to insure approrpaite hand placement and sequencing secondaty to confusion  Ambulation/Gait Ambulation/Gait assistance: Min assist Gait Distance (Feet): 15 Feet Assistive device: Rolling walker (2 wheeled)       General Gait Details: Pt able to ambulate in room w/ close supervision, direct guidance with the walker and constant encouragement  Stairs            Wheelchair Mobility     Modified Rankin (Stroke Patients Only)       Balance Overall balance assessment: Modified Independent(mental status is biggest balance/safety issues)                                           Pertinent Vitals/Pain      Home Living Family/patient expects to be discharged to:: Skilled nursing facility Living Arrangements: Non-relatives/Friends               Additional Comments: Pt living in mother-in-law suite on daughter's property but apparently does have 24/7 assist?    Prior Function Level of Independence: Needs assistance   Gait / Transfers Assistance Needed: Pt was apparently able to ambulate with cane     Comments: Severe dementia, h/o many falls with fxs, etc     Hand Dominance   Dominant Hand: Right    Extremity/Trunk Assessment   Upper Extremity Assessment Upper Extremity Assessment: Generalized weakness    Lower Extremity Assessment Lower Extremity Assessment: Generalized weakness(R LE minimally pain limited)       Communication   Communication: (pt with very limited communication 2/2 confusion)  Cognition Arousal/Alertness: Awake/alert Behavior During Therapy: Restless Overall Cognitive Status: History of cognitive impairments - at baseline                                 General Comments: pt  asking/repeating orientation questions t/o the session      General Comments      Exercises General Exercises - Lower Extremity Ankle Circles/Pumps: AROM;10 reps(pt inefficient in following instructions doing this) Heel Slides: Strengthening;10 reps;Right;AROM(resisted leg extension) Hip ABduction/ADduction: Strengthening;AROM;10 reps;Right Straight Leg Raises: Right;AAROM;10 reps   Assessment/Plan    PT Assessment Patient needs continued PT services  PT Problem List Decreased strength;Decreased range of motion;Decreased activity tolerance;Decreased balance;Decreased mobility;Decreased cognition;Decreased  knowledge of use of DME;Pain       PT Treatment Interventions Gait training;Functional mobility training;Therapeutic activities;Therapeutic exercise;Balance training;Cognitive remediation    PT Goals (Current goals can be found in the Care Plan section)  Acute Rehab PT Goals Patient Stated Goal: Pt too confused to participate PT Goal Formulation: With patient Time For Goal Achievement: 06/04/18 Potential to Achieve Goals: Fair    Frequency BID   Barriers to discharge        Co-evaluation               AM-PAC PT "6 Clicks" Mobility  Outcome Measure Help needed turning from your back to your side while in a flat bed without using bedrails?: A Little Help needed moving from lying on your back to sitting on the side of a flat bed without using bedrails?: A Little Help needed moving to and from a bed to a chair (including a wheelchair)?: A Lot Help needed standing up from a chair using your arms (e.g., wheelchair or bedside chair)?: A Lot Help needed to walk in hospital room?: A Lot Help needed climbing 3-5 steps with a railing? : Total 6 Click Score: 13    End of Session Equipment Utilized During Treatment: Gait belt Activity Tolerance: Patient tolerated treatment well(limited secondary to mental status) Patient left: with call bell/phone within reach;in chair;with nursing/sitter in room Nurse Communication: Mobility status PT Visit Diagnosis: Muscle weakness (generalized) (M62.81);Difficulty in walking, not elsewhere classified (R26.2);Pain Pain - Right/Left: Right Pain - part of body: Hip    Time: [id number] PT Time Calculation (min) (ACUTE ONLY): 38 min   Charges:   PT Evaluation $PT Eval Low Complexity: 1 Low PT Treatments $Gait Training: 8-22 mins $Therapeutic Exercise: 8-22 mins        Malachi ProGalen R Carmen Vallecillo, DPT 05/21/2018, 12:11 PM

## 2018-05-21 NOTE — Consult Note (Addendum)
ORTHOPAEDICS PROGRESS NOTE  PATIENT NAME: Katie Fitzgerald DOB: [birth year]  MRN: [id number]  POD # 1: ORIF of a right intertrochanteric femur fracture with removal of retained screws  Subjective: The patient is resting quietly. The nurse reported that the patient rested well last night following Seroquel.  Objective: Vital signs in last 24 hours: Temp:  [97.2 F (36.2 C)-98.9 F (37.2 C)] 98.9 F (37.2 C) (04/16 0433) Pulse Rate:  [58-104] 91 (04/16 0433) Resp:  [9-19] 16 (04/16 0433) BP: (103-127)/(53-75) 107/75 (04/16 0433) SpO2:  [90 %-100 %] 94 % (04/16 0433) Weight:  [51 kg] 51 kg (04/15 1228)  Intake/Output from previous day: 04/15 0701 - 04/16 0700 In: 700 [I.V.:700] Out: 1450 [Urine:1350; Blood:100]  Recent Labs    05/20/18 0226 05/20/18 0451 05/20/18 0802  WBC 12.4* 10.5  --   HGB 11.4* 11.7*  --   HCT 35.7* 36.8  --   PLT 282 282  --   K 3.9 3.8  --   CL 105 104  --   CO2 24 23  --   BUN 26* 25*  --   CREATININE 0.92 0.79  --   GLUCOSE 112* 130*  --   CALCIUM 9.1 9.0  --   INR  --   --  1.0    EXAM General: Well-developed well-nourished female seen in no apparent discomfort. Lungs: clear to auscultation Cardiac: normal rate and regular rhythm Right lower extremity: Dressings are intact.  Scant amount of dried blood to the proximal incision site.  No significant swelling to the thigh or knee.  Calf is soft and nontender.  Pedal pulses are palpable.  Assessment: ORIF of a right intertrochanteric femur fracture with removal of retained screws  Secondary diagnoses: Dementia Hypertension Iron deficiency anemia Osteoporosis  Plan: Begin physical therapy and Occupational Therapy.  The patient may be weightbearing as tolerated to the right lower extremity. DVT Prophylaxis - Lovenox, TED hose and SCDs  I left a message for the patient's daughter giving her an update of the patient's status and the new room number.  Marck Mcclenny P. Angie Fava M.D.

## 2018-05-21 NOTE — NC FL2 (Signed)
[place] MEDICAID FL2 LEVEL OF CARE SCREENING TOOL     IDENTIFICATION  Patient Name: Katie MainsDianne V Skillen Birthdate: 03/16/1940 Sex: female Admission Date (Current Location): 05/19/2018  [place]   Facility and Address:  [facility], [address], [place], [place] [address]      Provider Number: [id number]  Attending Physician Name and Address:  Bertrum SolSalary, Montell D, MD  Relative Name and Phone Number:       Current Level of Care: Hospital Recommended Level of Care: Skilled Nursing Facility Prior Approval Number:    Date Approved/Denied:   PASRR Number: [id number]  Discharge Plan: SNF    Current Diagnoses: Patient Active Problem List   Diagnosis Date Noted  . Hip fracture (HCC) 05/20/2018  . Hip fx (HCC) 03/22/2017  . Syncope 04/29/2015  . Fracture of left inferior pubic ramus (HCC) 04/29/2015    Orientation RESPIRATION BLADDER Height & Weight     Self  Normal Continent Weight: 112 lb 7 oz (51 kg) Height:  5\' 6"  (167.6 cm)  BEHAVIORAL SYMPTOMS/MOOD NEUROLOGICAL BOWEL NUTRITION STATUS  (none) (none) Continent Diet(regular diet )  AMBULATORY STATUS COMMUNICATION OF NEEDS Skin   Extensive Assist Verbally Surgical wounds(Righ Leg incision )                       Personal Care Assistance Level of Assistance  Bathing, Feeding, Dressing Bathing Assistance: Limited assistance Feeding assistance: Independent Dressing Assistance: Limited assistance     Functional Limitations Info  Sight, Hearing, Speech Sight Info: Adequate Hearing Info: Adequate Speech Info: Adequate    SPECIAL CARE FACTORS FREQUENCY  PT (By licensed PT), OT (By licensed OT)     PT Frequency: 5 OT Frequency: 5            Contractures Contractures Info: Not present    Additional Factors Info  Code Status, Allergies Code Status Info: Full Code  Allergies Info: Amoxicillin, Penicillin G           Current Medications  (05/21/2018):  This is the current hospital active medication list Current Facility-Administered Medications  Medication Dose Route Frequency Provider Last Rate Last Dose  . acetaminophen (OFIRMEV) IV 1,000 mg  1,000 mg Intravenous Q6H Hooten, Illene LabradorJames P, MD 400 mL/hr at 05/21/18 1153 1,000 mg at 05/21/18 1153  . acetaminophen (TYLENOL) tablet 325-650 mg  325-650 mg Oral Q6H PRN Hooten, Illene LabradorJames P, MD      . bisacodyl (DULCOLAX) suppository 10 mg  10 mg Rectal Daily PRN Hooten, Illene LabradorJames P, MD      . citalopram (CELEXA) tablet 20 mg  20 mg Oral Daily Hooten, Illene LabradorJames P, MD   20 mg at 05/21/18 1135  . enoxaparin (LOVENOX) injection 40 mg  40 mg Subcutaneous Q24H Hooten, Illene LabradorJames P, MD   40 mg at 05/21/18 1137  . feeding supplement (ENSURE ENLIVE) (ENSURE ENLIVE) liquid 237 mL  237 mL Oral BID BM Salary, Montell D, MD      . ferrous fumarate-b12-vitamic C-folic acid (TRINSICON / FOLTRIN) capsule 1 capsule  1 capsule Oral BID PC Hooten, Illene LabradorJames P, MD   1 capsule at 05/21/18 1138  . gabapentin (NEURONTIN) tablet 300 mg  300 mg Oral BID Donato HeinzHooten, James P, MD   300 mg at 05/21/18 1136  . HYDROmorphone (DILAUDID) injection 0.5-1 mg  0.5-1 mg Intravenous Q4H PRN Hooten, Illene LabradorJames P, MD      . labetalol (NORMODYNE,TRANDATE) injection 20 mg  20 mg Intravenous Q3H  PRN Hooten, Illene Labrador, MD      . magnesium hydroxide (MILK OF MAGNESIA) suspension 30 mL  30 mL Oral Daily PRN Hooten, Illene Labrador, MD   30 mL at 05/21/18 1142  . memantine (NAMENDA) tablet 10 mg  10 mg Oral BID Donato Heinz, MD   10 mg at 05/21/18 1137  . menthol-cetylpyridinium (CEPACOL) lozenge 3 mg  1 lozenge Oral PRN Hooten, Illene Labrador, MD       Or  . phenol (CHLORASEPTIC) mouth spray 1 spray  1 spray Mouth/Throat PRN Hooten, Illene Labrador, MD      . metoCLOPramide (REGLAN) tablet 5-10 mg  5-10 mg Oral Q8H PRN Hooten, Illene Labrador, MD       Or  . metoCLOPramide (REGLAN) injection 5-10 mg  5-10 mg Intravenous Q8H PRN Hooten, Illene Labrador, MD      . metoCLOPramide (REGLAN) tablet 10 mg  10  mg Oral TID AC & HS Hooten, Illene Labrador, MD   10 mg at 05/21/18 1137  . ondansetron (ZOFRAN) tablet 4 mg  4 mg Oral Q6H PRN Hooten, Illene Labrador, MD       Or  . ondansetron (ZOFRAN) injection 4 mg  4 mg Intravenous Q6H PRN Hooten, Illene Labrador, MD   4 mg at 05/20/18 2020  . oxyCODONE (Oxy IR/ROXICODONE) immediate release tablet 10 mg  10 mg Oral Q4H PRN Hooten, Illene Labrador, MD      . oxyCODONE (Oxy IR/ROXICODONE) immediate release tablet 5 mg  5 mg Oral Q4H PRN Hooten, Illene Labrador, MD   5 mg at 05/20/18 2025  . pantoprazole (PROTONIX) EC tablet 40 mg  40 mg Oral BID Donato Heinz, MD   40 mg at 05/21/18 1136  . potassium chloride SA (K-DUR,KLOR-CON) CR tablet 20 mEq  20 mEq Oral Daily Hooten, Illene Labrador, MD   20 mEq at 05/21/18 1137  . QUEtiapine (SEROQUEL) tablet 25 mg  25 mg Oral QHS Hooten, Illene Labrador, MD   25 mg at 05/20/18 2236  . senna-docusate (Senokot-S) tablet 1 tablet  1 tablet Oral BID Hooten, Illene Labrador, MD   1 tablet at 05/21/18 1137  . sodium phosphate (FLEET) 7-19 GM/118ML enema 1 enema  1 enema Rectal Once PRN Hooten, Illene Labrador, MD      . traZODone (DESYREL) tablet 25 mg  25 mg Oral QHS PRN Donato Heinz, MD   25 mg at 05/20/18 0457     Discharge Medications: Please see discharge summary for a list of discharge medications.  Relevant Imaging Results:  Relevant Lab Results:   Additional Information SSN: [id number]  Ruthe Mannan, [place]

## 2018-05-21 NOTE — Progress Notes (Signed)
[employer] - [place] [facility]   PATIENT NAME: Katie Fitzgerald    MR#:  [id number]  DATE OF BIRTH:  December 12, 1940  SUBJECTIVE:  CHIEF COMPLAINT:   Chief Complaint  Patient presents with  . Fall  . Hip Pain    REVIEW OF SYSTEMS:  CONSTITUTIONAL: No fever, fatigue or weakness.  EYES: No blurred or double vision.  EARS, NOSE, AND THROAT: No tinnitus or ear pain.  RESPIRATORY: No cough, shortness of breath, wheezing or hemoptysis.  CARDIOVASCULAR: No chest pain, orthopnea, edema.  GASTROINTESTINAL: No nausea, vomiting, diarrhea or abdominal pain.  GENITOURINARY: No dysuria, hematuria.  ENDOCRINE: No polyuria, nocturia,  HEMATOLOGY: No anemia, easy bruising or bleeding SKIN: No rash or lesion. MUSCULOSKELETAL: No joint pain or arthritis.   NEUROLOGIC: No tingling, numbness, weakness.  PSYCHIATRY: No anxiety or depression.   ROS  DRUG ALLERGIES:   Allergies  Allergen Reactions  . Amoxicillin     Did it involve swelling of the face/tongue/throat, SOB, or low BP? Unknown Did it involve sudden or severe rash/hives, skin peeling, or any reaction on the inside of your mouth or nose? Unknown Did you need to seek medical attention at a hospital or doctor's office? Unknown When did it last happen?Unknown If all above answers are "NO", may proceed with cephalosporin use.  Marland Kitchen Penicillin G     Did it involve swelling of the face/tongue/throat, SOB, or low BP? Unknown Did it involve sudden or severe rash/hives, skin peeling, or any reaction on the inside of your mouth or nose? Unknown Did you need to seek medical attention at a hospital or doctor's office? Unknown When did it last happen?Unknown If all above answers are "NO", may proceed with cephalosporin use.    VITALS:  Blood pressure (!) 115/52, pulse 91, temperature 100 F (37.8 C), temperature source Axillary, resp. rate 16, height  (1.676 m), weight 51 kg, SpO2 95 %.  PHYSICAL EXAMINATION:   GENERAL:  78 y.o.-year-old patient lying in the bed with no acute distress.  EYES: Pupils equal, round, reactive to light and accommodation. No scleral icterus. Extraocular muscles intact.  HEENT: Head atraumatic, normocephalic. Oropharynx and nasopharynx clear.  NECK:  Supple, no jugular venous distention. No thyroid enlargement, no tenderness.  LUNGS: Normal breath sounds bilaterally, no wheezing, rales,rhonchi or crepitation. No use of accessory muscles of respiration.  CARDIOVASCULAR: S1, S2 normal. No murmurs, rubs, or gallops.  ABDOMEN: Soft, nontender, nondistended. Bowel sounds present. No organomegaly or mass.  EXTREMITIES: No pedal edema, cyanosis, or clubbing.  NEUROLOGIC: Cranial nerves II through XII are intact. Muscle strength 5/5 in all extremities. Sensation intact. Gait not checked.  PSYCHIATRIC: The patient is alert and oriented x 3.  SKIN: No obvious rash, lesion, or ulcer.   Physical Exam LABORATORY PANEL:   CBC Recent Labs  Lab 05/20/18 0451  WBC 10.5  HGB 11.7*  HCT 36.8  PLT 282   ------------------------------------------------------------------------------------------------------------------  Chemistries  Recent Labs  Lab 05/20/18 0451  NA 138  K 3.8  CL 104  CO2 23  GLUCOSE 130*  BUN 25*  CREATININE 0.79  CALCIUM 9.0   ------------------------------------------------------------------------------------------------------------------  Cardiac Enzymes No results for input(s): TROPONINI in the last 168 hours. ------------------------------------------------------------------------------------------------------------------  RADIOLOGY:  Ct Pelvis Wo Contrast  Result Date: 05/20/2018 CLINICAL DATA:  Recent fall with right hip pain, initial encounter EXAM: CT PELVIS WITHOUT CONTRAST TECHNIQUE: Multidetector CT imaging of the pelvis was performed following the standard protocol without intravenous contrast. COMPARISON:  Plain film from  earlier in the  same day. FINDINGS: Urinary Tract:  Bladder is well distended. Bowel: Scattered diverticular changes noted without evidence of diverticulitis. No obstructive changes are seen. Vascular/Lymphatic: Vascular calcifications are noted. No lymphadenopathy is seen. Reproductive:  No mass or other significant abnormality Other:  No free fluid is noted. Musculoskeletal: Postsurgical changes are noted in the proximal femurs bilaterally. Old healed left inferior pubic ramus fracture is noted. Postsurgical changes in the lower lumbar spine are noted. There are changes consistent with acute intratrochanteric fracture without significant displacement. No muscular hematoma is seen. No joint effusion is noted. IMPRESSION: Acute undisplaced intratrochanteric fracture on the right. Chronic changes as described above. Electronically Signed   By: Alcide CleverMark  Lukens M.D.   On: 05/20/2018 00:44   Dg Chest Portable 1 View  Result Date: 05/20/2018 CLINICAL DATA:  78 y/o  F; preop for hip fracture. EXAM: PORTABLE CHEST 1 VIEW COMPARISON:  09/08/2017 chest radiograph FINDINGS: Stable cardiac silhouette within normal limits given projection and technique. Aortic atherosclerosis with calcification. Mild biapical pleuroparenchymal scarring. No focal consolidation. No pleural effusion or pneumothorax. No acute osseous abnormality is evident. IMPRESSION: No acute pulmonary process identified. Electronically Signed   By: Mitzi HansenLance  Furusawa-Stratton M.D.   On: 05/20/2018 01:55   Dg Humerus Right  Result Date: 05/19/2018 CLINICAL DATA:  Right arm pain following fall, initial encounter EXAM: RIGHT HUMERUS - 2+ VIEW COMPARISON:  None. FINDINGS: There is no evidence of fracture or other focal bone lesions. Soft tissues are unremarkable. IMPRESSION: No acute abnormality noted. Electronically Signed   By: Alcide CleverMark  Lukens M.D.   On: 05/19/2018 22:33   Dg Hip Operative Unilat W Or W/o Pelvis Right  Result Date: 05/20/2018 CLINICAL DATA:  Intramedullary  nail of right femur EXAM: OPERATIVE right HIP (WITH PELVIS IF PERFORMED) 7 VIEWS TECHNIQUE: Fluoroscopic spot image(s) were submitted for interpretation post-operatively. COMPARISON:  None. FINDINGS: Fluoroscopic images show placement of an intramedullary nail at the right femur with interlocking inter trochanteric screw. The previously present lag screws have been removed. IMPRESSION: Intraoperative fluoroscopy. Electronically Signed   By: Deatra RobinsonKevin  Herman M.D.   On: 05/20/2018 18:12   Dg Hip Unilat W Or Wo Pelvis 2-3 Views Right  Result Date: 05/19/2018 CLINICAL DATA:  Tripped over dog with right hip pain, initial encounter EXAM: DG HIP (WITH OR WITHOUT PELVIS) 2-3V RIGHT COMPARISON:  03/23/2017 FINDINGS: Postsurgical changes are noted in the proximal femurs bilaterally. The pelvic ring is intact. Old healed fractures are noted. Postsurgical changes in the lower lumbar spine are seen. No acute fracture or hardware failure is noted. IMPRESSION: Chronic postsurgical and posttraumatic changes. No acute abnormality noted. Electronically Signed   By: Alcide CleverMark  Lukens M.D.   On: 05/19/2018 22:32   Dg Femur Min 2 Views Right  Result Date: 05/20/2018 CLINICAL DATA:  78 y/o  F; right leg and thigh pain. EXAM: RIGHT FEMUR 2 VIEWS COMPARISON:  05/19/2018 pelvis and right hip radiographs. FINDINGS: No acute fracture or dislocation identified. Postsurgical changes of the right proximal femur with pinning of femoral neck. Severe tricompartmental osteoarthrosis of the knee joint with loss of the joint space and periarticular osteophytes. IMPRESSION: No acute fracture or dislocation identified. 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 Electronically Signed   By: Mitzi HansenLance  Furusawa-Stratton M.D.   On: 05/20/2018 00:29   Dg Femur [place] Views Right  Result Date: 05/20/2018 CLINICAL DATA:  Postoperative radiograph following right intramedullary femoral nail EXAM: RIGHT FEMUR PORTABLE 2 VIEW COMPARISON:   05/19/2018 FINDINGS:  Status post removal of right femoral neck lag screws with placement of antegrade intramedullary nail with interlocking component along the femoral neck. There is no periprosthetic fracture. Alignment is normal. Expected postoperative gas and surgical drains. IMPRESSION: Expected postoperative appearance of antegrade intramedullary nail of the right femur. Electronically Signed   By: Deatra Robinson M.D.   On: 05/20/2018 18:15    ASSESSMENT AND PLAN:  *Acute right hip fracture secondary to mechanical fall Stable Status post ORIF by orthopedic surgery/Dr. Ernest Pine, recovering well, PT/OT to evaluate/treat, continue adult pain protocol  *Chronic dementia  Increase nursing care PRN, aspiration/fall/negative cautions while in house  Continue Aricept and Namenda   *Chronic depression Stable onCelexa, Seroquel and trazodone   *Chronic anemia Stable Continue iron supplementation   *Chronic essential hypertension  Stable   Disposition to skilled nursing facility status post discharge in 1-2 days barring any complications  All the records are reviewed and case discussed with Care Management/Social Workerr. Management plans discussed with the patient, family and they are in agreement.  CODE STATUS: full  TOTAL TIME TAKING CARE OF THIS PATIENT: 30 minutes.     POSSIBLE D/C IN 1-2 DAYS, DEPENDING ON CLINICAL CONDITION.   Evelena Asa  M.D on 05/21/2018   Between 7am to 6pm - Pager - [phone]  After 6pm go to [email or web address] - password [email or web address]  [facility] [place]  Office  [phone]  CC: Primary care physician; Patient, No Pcp Per  Note: This dictation was prepared with Dragon dictation along with smaller phrase technology. Any transcriptional errors that result from this process are unintentional.

## 2018-05-21 NOTE — TOC Initial Note (Addendum)
Transition of Care [facility]) - Initial/Assessment Note    Patient Details  Name: Katie Fitzgerald MRN: [id number] Date of Birth: May 24, 1940  [facility] CM/SW Contact:    Ruthe Mannan, LCSWA Phone Number: 05/21/2018, 3:53 PM  Clinical Narrative:  CSW consulted for facility placement. CSW spoke with patient's daughter Robina Ade [phone]. Daughter reports that patient lives in a garage apartment at her home. Daughter states that patient has 24/7 caregivers. Daughter reports that patient has had multiple falls and is not safe to be alone. Daughter is interested in SNF placement. CSW explained PT recommendation and daughter is in agreement with bed search. Daughter states that patient has been to [facility] and [facility] in the past and that would be her preference again if possible. CSW will begin bed search and give bed offers once received.                   Expected Discharge Plan: Skilled Nursing Facility Barriers to Discharge: Continued Medical Work up, SNF Pending bed offer   Patient Goals and CMS Choice Patient states their goals for this hospitalization and ongoing recovery are:: " i want to get well"  CMS Medicare.gov Compare Post Acute Care list provided to:: Patient    Expected Discharge Plan and Services Expected Discharge Plan: Skilled Nursing Facility       Living arrangements for the past 2 months: Single Family Home                          Prior Living Arrangements/Services Living arrangements for the past 2 months: Single Family Home Lives with:: Adult Children Patient language and need for interpreter reviewed:: Yes Do you feel safe going back to the place where you live?: Yes      Need for Family Participation in Patient Care: Yes (Comment) Care giver support system in place?: Yes (comment)   Criminal Activity/Legal Involvement Pertinent to Current Situation/Hospitalization: No - Comment as needed  Activities of Daily  Living Home Assistive Devices/Equipment: Eyeglasses, Dan Humphreys (specify type) ADL Screening (condition at time of admission) Patient's cognitive ability adequate to safely complete daily activities?: No Is the patient deaf or have difficulty hearing?: Yes Does the patient have difficulty seeing, even when wearing glasses/contacts?: Yes Does the patient have difficulty concentrating, remembering, or making decisions?: Yes Patient able to express need for assistance with ADLs?: Yes Does the patient have difficulty dressing or bathing?: Yes Independently performs ADLs?: No Communication: Independent Dressing (OT): Needs assistance Is this a change from baseline?: Pre-admission baseline Grooming: Needs assistance Is this a change from baseline?: Pre-admission baseline Feeding: Needs assistance Is this a change from baseline?: Pre-admission baseline Bathing: Needs assistance Is this a change from baseline?: Pre-admission baseline Toileting: Needs assistance Is this a change from baseline?: Pre-admission baseline In/Out Bed: Needs assistance Is this a change from baseline?: Pre-admission baseline Walks in Home: Needs assistance Is this a change from baseline?: Pre-admission baseline Does the patient have difficulty walking or climbing stairs?: Yes Weakness of Legs: Both Weakness of Arms/Hands: Both  Permission Sought/Granted Permission sought to share information with : Case Manager, Magazine features editor, Family Supports Permission granted to share information with : Yes, Verbal Permission Granted              Emotional Assessment Appearance:: Appears stated age Attitude/Demeanor/Rapport: Complaining Affect (typically observed): Restless Orientation: : Oriented to Self Alcohol / Substance Use: Not Applicable Psych Involvement: No (comment)  Admission  diagnosis:  Right hip pain [M25.551] Contusion of right hip, initial encounter [S70.01XA] Closed nondisplaced  intertrochanteric fracture of right femur, initial encounter [facility](HCC) [S72.144A] Patient Active Problem List   Diagnosis Date Noted  . Hip fracture (HCC) 05/20/2018  . Hip fx (HCC) 03/22/2017  . Syncope 04/29/2015  . Fracture of left inferior pubic ramus (HCC) 04/29/2015   PCP:  Patient, No Pcp Per Pharmacy:   [facility] [id number] - [place], [place] - [address] [place] [address] Phone: [phone] Fax: [phone]     Social Determinants of Health (SDOH) Interventions    Readmission Risk Interventions No flowsheet data found.

## 2018-05-21 NOTE — Progress Notes (Signed)
Physical Therapy Treatment Patient Details Name: Katie Fitzgerald MRN: [id number] DOB: 04/11/1940 Today's Date: 05/21/2018    History of Present Illness 78 y.o. female with a known history of hypertension, dementia and osteoporosis, who presented to the emergency room with acute onset of right hip pain after fall.  Pt has h/o multiple falls with multiple LE fx/surgical repairs.      PT Comments    Pt agreeable to PT; reports R hip is sore 2/10 via faces scale, and increases to 4/10 with ambulation. Pt pleasantly confused throughout session. Unaware as to why R hip is sore and perseverates on "Katie Fitzgerald", pt's dog (if we have seen her, where she is; thinks she sees her in the hallway). Pt progresses ambulation to 80 ft and 25 ft today. Requires Mod A throughout for safety/balance and rolling walker management. Transfers with Min A for sequencing and safety. Pt received up in chair at session's end. Continue PT to progress strength, balance, safety to improve functional mobility.    Follow Up Recommendations  SNF     Equipment Recommendations  None recommended by PT    Recommendations for Other Services       Precautions / Restrictions Precautions Precautions: Fall Restrictions Weight Bearing Restrictions: Yes RLE Weight Bearing: Weight bearing as tolerated    Mobility  Bed Mobility Overal bed mobility: Needs Assistance Bed Mobility: Supine to Sit     Supine to sit: Min assist     General bed mobility comments: Sequence cues throughout  Transfers Overall transfer level: Needs assistance Equipment used: Rolling walker (2 wheeled) Transfers: Sit to/from Stand Sit to Stand: Min assist         General transfer comment: sequence/safety cues throughout  Ambulation/Gait Ambulation/Gait assistance: Min assist Gait Distance (Feet): 80 Feet(second walk 25 ft in room) Assistive device: Rolling walker (2 wheeled) Gait Pattern/deviations: Step-through pattern;Step-to  pattern;Decreased step length - left;Decreased stance time - right   Gait velocity interpretation: <1.31 ft/sec, indicative of household ambulator General Gait Details: Requires assist often with rw to maneuver away from wall/around objects (overall poor safety awareness) and with turns. Pt does well with R/L directional cues overall, but does have difficulty processing cues to avoid objects and landmark cues (ie. head toward the chair)   Stairs             Wheelchair Mobility    Modified Rankin (Stroke Patients Only)       Balance Overall balance assessment: Needs assistance Sitting-balance support: Bilateral upper extremity supported;Feet supported Sitting balance-Leahy Scale: Good     Standing balance support: Bilateral upper extremity supported Standing balance-Leahy Scale: Fair(F (-), occasional posterior lean/issues out of rw parameters) Standing balance comment: Tendency to move feet outside of rw parameters with poor awareness and lean away from rw/feet causing imbalance. Occaional posterior lean as well.                             Cognition Arousal/Alertness: Awake/alert Behavior During Therapy: WFL for tasks assessed/performed Overall Cognitive Status: History of cognitive impairments - at baseline                                 General Comments: pt expressing worry over her dog, Katie Fitzgerald; follows simple commands well, able to gently redirect with cues      Exercises Other Exercises Other Exercises: functional transfer training with RW use during  toileting hygiene    General Comments        Pertinent Vitals/Pain Pain Assessment: Faces Faces Pain Scale: Hurts a little bit("a little sore"; noted to increase with ambulation 4/10) Pain Location: R hip Pain Intervention(s): Limited activity within patient's tolerance;Monitored during session;Repositioned    Home Living Family/patient expects to be discharged to:: Skilled nursing  facility Living Arrangements: Non-relatives/Friends             Additional Comments: Pt living in mother-in-law suite on daughter's property but apparently does have 24/7 assist?    Prior Function Level of Independence: Needs assistance  Gait / Transfers Assistance Needed: Pt was apparently able to ambulate with cane   Comments: Severe dementia, h/o many falls with fxs, etc   PT Goals (current goals can now be found in the care plan section) Acute Rehab PT Goals Patient Stated Goal: Pt too confused to participate Progress towards PT goals: Progressing toward goals    Frequency    BID      PT Plan Current plan remains appropriate    Co-evaluation              AM-PAC PT "6 Clicks" Mobility   Outcome Measure  Help needed turning from your back to your side while in a flat bed without using bedrails?: A Little Help needed moving from lying on your back to sitting on the side of a flat bed without using bedrails?: A Little Help needed moving to and from a bed to a chair (including a wheelchair)?: A Lot Help needed standing up from a chair using your arms (e.g., wheelchair or bedside chair)?: A Little Help needed to walk in hospital room?: A Lot Help needed climbing 3-5 steps with a railing? : A Lot 6 Click Score: 15    End of Session Equipment Utilized During Treatment: Gait belt Activity Tolerance: Patient tolerated treatment well Patient left: in chair;with call bell/phone within reach;with chair alarm set   PT Visit Diagnosis: Muscle weakness (generalized) (M62.81);Difficulty in walking, not elsewhere classified (R26.2);Pain Pain - Right/Left: Right Pain - part of body: Hip     Time: 5038-8828 PT Time Calculation (min) (ACUTE ONLY): 31 min  Charges:  $Gait Training: 8-22 mins $Therapeutic Activity: 8-22 mins                      Katie Fitzgerald, PTA 05/21/2018, 3:37 PM

## 2018-05-21 NOTE — Evaluation (Signed)
Occupational Therapy Evaluation Patient Details Name: Katie Fitzgerald MRN: [id number] DOB: 11/17/1940 Today's Date: 05/21/2018    History of Present Illness 78 y.o. female with a known history of hypertension, dementia and osteoporosis, who presented to the emergency room with acute onset of right hip pain after fall.  Pt has h/o multiple falls with multiple LE fx/surgical repairs.     Clinical Impression   Pt seen for OT evaluation this date. Pt pleasantly confused. Worried about her dog, [name]. Emotional support and redirection provided. Pt able to perform functional STS transfers for toileting hygiene and placement of chair alarm in the recliner with Min A for transfer and verbal cues for hand placement on RW. Pt requires Mod A for LB ADL tasks and Min A for functional transfers, with cues for safety/sequencing. Pt will benefit from skilled OT services to maximize return to PLOF, minimize risk of falls and minimize caregiver burden. Recommend STR upon discharge.    Follow Up Recommendations  SNF    Equipment Recommendations  Other (comment);3 in 1 bedside commode    Recommendations for Other Services       Precautions / Restrictions Precautions Precautions: Fall Restrictions Weight Bearing Restrictions: Yes RLE Weight Bearing: Weight bearing as tolerated      Mobility Bed Mobility     General bed mobility comments: deferred, up in recliner at start/end of session  Transfers Overall transfer level: Needs assistance Equipment used: Rolling walker (2 wheeled) Transfers: Sit to/from Stand Sit to Stand: Min assist         General transfer comment: cues for sequencing, hand placement on RW    Balance Overall balance assessment: Modified Independent(mental status is biggest balance/safety issues)                                         ADL either performed or assessed with clinical judgement   ADL Overall ADL's : Needs  assistance/impaired Eating/Feeding: Sitting;Set up   Grooming: Set up;Sitting   Upper Body Bathing: Sitting;Minimal assistance   Lower Body Bathing: Sit to/from stand;Moderate assistance   Upper Body Dressing : Sitting;Minimal assistance   Lower Body Dressing: Sit to/from stand;Moderate assistance   Toilet Transfer: Minimal assistance;BSC;Ambulation;RW;Cueing for sequencing;Cueing for safety   Toileting- Clothing Manipulation and Hygiene: Sitting/lateral lean;Maximal assistance               Vision Patient Visual Report: No change from baseline       Perception     Praxis      Pertinent Vitals/Pain Pain Assessment: Faces Faces Pain Scale: Hurts a little bit Pain Location: R hip with movement "it's just a little" Pain Intervention(s): Monitored during session;Limited activity within patient's tolerance;Repositioned;Ice applied     Hand Dominance Right   Extremity/Trunk Assessment Upper Extremity Assessment Upper Extremity Assessment: Generalized weakness   Lower Extremity Assessment Lower Extremity Assessment: Generalized weakness;Defer to PT evaluation   Cervical / Trunk Assessment Cervical / Trunk Assessment: Normal   Communication Communication Communication: (pt with very limited communication 2/2 confusion)   Cognition Arousal/Alertness: Awake/alert Behavior During Therapy: WFL for tasks assessed/performed Overall Cognitive Status: History of cognitive impairments - at baseline                                 General Comments: pt expressing worry over her dog, [name]; follows simple  commands well, able to gently redirect with cues   General Comments       Exercises Other Exercises Other Exercises: functional transfer training with RW use during toileting hygiene   Shoulder Instructions      Home Living Family/patient expects to be discharged to:: Skilled nursing facility Living Arrangements: Non-relatives/Friends                                Additional Comments: Pt living in mother-in-law suite on daughter's property but apparently does have 24/7 assist?      Prior Functioning/Environment Level of Independence: Needs assistance  Gait / Transfers Assistance Needed: Pt was apparently able to ambulate with cane     Comments: Severe dementia, h/o many falls with fxs, etc        OT Problem List: Decreased strength;Decreased knowledge of use of DME or AE;Decreased knowledge of precautions;Decreased cognition;Impaired balance (sitting and/or standing);Decreased safety awareness;Pain      OT Treatment/Interventions: Self-care/ADL training;Therapeutic exercise;Visual/perceptual remediation/compensation    OT Goals(Current goals can be found in the care plan section) Acute Rehab OT Goals Patient Stated Goal: Pt too confused to participate OT Goal Formulation: Patient unable to participate in goal setting Time For Goal Achievement: 06/04/18 Potential to Achieve Goals: Good ADL Goals Pt Will Perform Lower Body Dressing: with mod assist;sit to/from stand Pt Will Transfer to Toilet: with min assist;ambulating;bedside commode Pt Will Perform Toileting - Clothing Manipulation and hygiene: with min assist;sit to/from stand  OT Frequency: Min 2X/week   Barriers to D/C:            Co-evaluation              AM-PAC OT "6 Clicks" Daily Activity     Outcome Measure Help from another person eating meals?: A Little Help from another person taking care of personal grooming?: A Little Help from another person toileting, which includes using toliet, bedpan, or urinal?: A Lot Help from another person bathing (including washing, rinsing, drying)?: A Lot Help from another person to put on and taking off regular upper body clothing?: A Little Help from another person to put on and taking off regular lower body clothing?: A Lot 6 Click Score: 15   End of Session Equipment Utilized During Treatment: Gait  belt;Rolling walker  Activity Tolerance: Patient tolerated treatment well Patient left: in chair;with call bell/phone within reach;with chair alarm set  OT Visit Diagnosis: Other abnormalities of gait and mobility (R26.89);Pain Pain - Right/Left: Right Pain - part of body: Hip                Time: 7473-4037 OT Time Calculation (min): 15 min Charges:  OT General Charges $OT Visit: 1 Visit OT Evaluation $OT Eval Moderate Complexity: 1 Mod   Richrd Prime, MPH, MS, OTR/L ascom [phone] 05/21/18, 1:56 PM

## 2018-05-21 NOTE — Progress Notes (Signed)
Initial Nutrition Assessment  RD working remotely.  DOCUMENTATION CODES:   Underweight  INTERVENTION:  Agree with regular diet.  Provide Ensure Enlive po BID, each supplement provides 350 kcal and 20 grams of protein.   NUTRITION DIAGNOSIS:   Increased nutrient needs related to post-op healing as evidenced by estimated needs.  GOAL:   Patient will meet greater than or equal to 90% of their needs  MONITOR:   PO intake, Supplement acceptance, Labs, Weight trends, Skin, I & O's  REASON FOR ASSESSMENT:   Other (Comment)(Low BMI)    ASSESSMENT:   78 year old female with PMHx of dementia, HTN, OA, OP, iron deficiency anemia admitted after mechanical fall found to have right hip fracture s/p ORIF on 4/15.   Patient unable to provide any history in setting of dementia. Unsure how patient is eating as meal completion not yet documented in chart. Weight looks fairly stable in chart. Patient is currently 51 kg (112.43 lbs). Patient has increased nutrient needs for post-operative healing. RD suspects patient may be malnourished, but unable to determine without completing NFPE.  Medications reviewed and include: Trinsicon/Foltrin 1 capsule BID, gabapentin, Reglan 10 mg TID 4/15-4/17, pantoprazole, potassium chloride 20 mEq daily, Seroquel, senna-docusate, IV acetaminophen.  Labs reviewed: BUN 25.  NUTRITION - FOCUSED PHYSICAL EXAM:  Unable to complete at this time.  Diet Order:   Diet Order            Diet regular Room service appropriate? Yes; Fluid consistency: Thin  Diet effective now             EDUCATION NEEDS:   Not appropriate for education at this time  Skin:  Skin Assessment: Skin Integrity Issues:(closed incision right leg)  Last BM:  Unknown  Height:   Ht Readings from Last 1 Encounters:  05/19/18 5\' 6"  (1.676 m)   Weight:   Wt Readings from Last 1 Encounters:  05/20/18 51 kg   Ideal Body Weight:  59.1 kg  BMI:  Body mass index is 18.15  kg/m.  Estimated Nutritional Needs:   Kcal:  8811-0315  Protein:  60-70 grams  Fluid:  1.2-1.4 L/day  Helane Rima, MS, RD, LDN Office: [phone] Pager: [phone] After Hours/Weekend Pager: [phone]

## 2018-05-22 MED ORDER — ENOXAPARIN SODIUM 40 MG/0.4ML ~~LOC~~ SOLN: 40.0000 mg | SUBCUTANEOUS | 0 refills | Status: AC

## 2018-05-22 MED ORDER — HYDROCODONE-ACETAMINOPHEN 5-325 MG PO TABS: 1.0000 | ORAL_TABLET | Freq: Four times a day (QID) | ORAL | 0 refills | Status: AC | PRN

## 2018-05-22 NOTE — Consult Note (Signed)
ORTHOPAEDICS PROGRESS NOTE  PATIENT NAME: Katie Fitzgerald DOB: 09-24-1940  MRN: [id number]  POD # 2: ORIF of a right intertrochanteric femur fracture with removal of retained screws  Subjective: The patient is resting comfortably. She was up to the bedside commode with nursing earlier this morning.  Objective: Vital signs in last 24 hours: Temp:  [98.6 F (37 C)-99 F (37.2 C)] 99 F (37.2 C) (04/16 2334) Pulse Rate:  [82-99] 99 (04/16 2334) Resp:  [18] 18 (04/16 2334) BP: (104-125)/(48-64) 115/64 (04/16 2334) SpO2:  [96 %-100 %] 97 % (04/16 2334)  Intake/Output from previous day: 04/16 0701 - 04/17 0700 In: 100 [IV Piggyback:100] Out: 0   Recent Labs    05/20/18 0226 05/20/18 0451 05/20/18 0802  WBC 12.4* 10.5  --   HGB 11.4* 11.7*  --   HCT 35.7* 36.8  --   PLT 282 282  --   K 3.9 3.8  --   CL 105 104  --   CO2 24 23  --   BUN 26* 25*  --   CREATININE 0.92 0.79  --   GLUCOSE 112* 130*  --   CALCIUM 9.1 9.0  --   INR  --   --  1.0    EXAM General: Well-developed well-nourished female seen in no apparent discomfort. Lungs: clear to auscultation Cardiac: normal rate and regular rhythm Right lower extremity: Dressings are dry and intact.  No significant swelling to the thigh or knee.  No erythema or significant ecchymosis is appreciated. Neurologic: Arousable.  Confused.  Sensory and motor function are grossly intact.  Assessment: ORIF of a right intertrochanteric femur fracture with removal of retained screws  Secondary diagnoses: Dementia Hypertension Iron deficiency anemia Osteoporosis  Plan: Notes from physical therapy were reviewed.  The patient did extremely well with her ambulation yesterday, although her cognition is still a limiting factor. Plan is to go Skilled nursing facility after hospital stay. DVT Prophylaxis - Lovenox, TED hose and SCDs  Isay Perleberg P. Angie Fava M.D.

## 2018-05-22 NOTE — Progress Notes (Signed)
Report called to [place] at [facility]  NT will prepare patient for transport via EMS. IV removed. AVS printed.

## 2018-05-22 NOTE — TOC Transition Note (Signed)
Transition of Care [facility]) - CM/SW Discharge Note   Patient Details  Name: Katie Fitzgerald MRN: [id number] Date of Birth: 1940-05-16  Transition of Care [facility]) CM/SW Contact:  Ruthe Mannan, LCSWA Phone Number: 05/22/2018, 12:17 PM   Clinical Narrative: Patient is medically ready for discharge today. CSW notified patient's daughter Robina Ade of discharge today to [facility]. CSW also notified Sue Lush at [facility] of discharge today. Patient will be transported by EMS. RN to call report and call for transport.       Final next level of care: Skilled Nursing Facility Barriers to Discharge: No Barriers Identified   Patient Goals and CMS Choice Patient states their goals for this hospitalization and ongoing recovery are:: " i want to get well"  CMS Medicare.gov Compare Post Acute Care list provided to:: Patient Represenative (must comment) Choice offered to / list presented to : Adult Children  Discharge Placement   Existing PASRR number confirmed : 05/21/18          Patient chooses bed at: [facility] Patient to be transferred to facility by: EMS Name of family member notified: Robina Ade- daughter  Patient and family notified of of transfer: 05/22/18  Discharge Plan and Services                          Social Determinants of Health (SDOH) Interventions     Readmission Risk Interventions No flowsheet data found.

## 2018-05-22 NOTE — Discharge Summary (Signed)
[facility] [place] [facility]   PATIENT NAME: Katie DallasDianne Larkey    MR#:  [id number]  DATE OF BIRTH:  12/02/1940  DATE OF ADMISSION:  05/19/2018 ADMITTING PHYSICIAN: Hannah BeatJan A Mansy, MD  DATE OF DISCHARGE: No discharge date for patient encounter.  PRIMARY CARE PHYSICIAN: Patient, No Pcp Per    ADMISSION DIAGNOSIS:  Right hip pain [M25.551] Contusion of right hip, initial encounter [S70.01XA] Closed nondisplaced intertrochanteric fracture of right femur, initial encounter (HCC) [S72.144A]  DISCHARGE DIAGNOSIS:  Active Problems:   Hip fracture (HCC)   SECONDARY DIAGNOSIS:   Past Medical History:  Diagnosis Date  . Dementia (HCC)   . Hypertension   . Iron deficiency anemia   . Osteoarthrosis    bilateral knees  . Osteoporosis     HOSPITAL COURSE:   *Acute right hip fracture secondary to mechanical fall Stable S/p ORIF by orthopedic surgery/Dr. Ernest PineHooten, recovering well, PT/OT recommending skilled nursing facility status post discharge  *Chronic dementia  Stable Provided increased nursing care PRN, aspiration/fall/negative cautions while in house  Continued Aricept and Namenda   *Chronic depression Stable onCelexa, Seroquel and trazodone   *Chronic anemia Stable Continued iron supplementation   *Chronic essential hypertension  Stable on current regimen  DISCHARGE CONDITIONS:   stable  CONSULTS OBTAINED:  Treatment Team:  Donato HeinzHooten, James P, MD  DRUG ALLERGIES:   Allergies  Allergen Reactions  . Amoxicillin     Did it involve swelling of the face/tongue/throat, SOB, or low BP? Unknown Did it involve sudden or severe rash/hives, skin peeling, or any reaction on the inside of your mouth or nose? Unknown Did you need to seek medical attention at a hospital or doctor's office? Unknown When did it last happen?Unknown If all above answers are "NO", may proceed with cephalosporin use.  Marland Kitchen. Penicillin G     Did it involve  swelling of the face/tongue/throat, SOB, or low BP? Unknown Did it involve sudden or severe rash/hives, skin peeling, or any reaction on the inside of your mouth or nose? Unknown Did you need to seek medical attention at a hospital or doctor's office? Unknown When did it last happen?Unknown If all above answers are "NO", may proceed with cephalosporin use.    DISCHARGE MEDICATIONS:   Allergies as of 05/22/2018      Reactions   Amoxicillin    Did it involve swelling of the face/tongue/throat, SOB, or low BP? Unknown Did it involve sudden or severe rash/hives, skin peeling, or any reaction on the inside of your mouth or nose? Unknown Did you need to seek medical attention at a hospital or doctor's office? Unknown When did it last happen?Unknown If all above answers are "NO", may proceed with cephalosporin use.   Penicillin G    Did it involve swelling of the face/tongue/throat, SOB, or low BP? Unknown Did it involve sudden or severe rash/hives, skin peeling, or any reaction on the inside of your mouth or nose? Unknown Did you need to seek medical attention at a hospital or doctor's office? Unknown When did it last happen?Unknown If all above answers are "NO", may proceed with cephalosporin use.      Medication List    TAKE these medications   acetaminophen 650 MG CR tablet Commonly known as:  TYLENOL Take 650 mg by mouth every 8 (eight) hours as needed for pain.   acetaminophen 325 MG tablet Commonly known as:  TYLENOL Take 2 tablets (650 mg total) by mouth every 6 (six) hours as  needed for mild pain (or Fever >/= 101).   alendronate 70 MG tablet Commonly known as:  FOSAMAX Take 70 mg by mouth every 7 (seven) days.   citalopram 20 MG tablet Commonly known as:  CELEXA Take 1 tablet (20 mg total) by mouth daily.   docusate sodium 100 MG capsule Commonly known as:  COLACE Take 1 capsule (100 mg total) by mouth 2 (two) times daily.   enoxaparin 40 MG/0.4ML  injection Commonly known as:  LOVENOX Inject 0.4 mLs (40 mg total) into the skin daily.   feeding supplement (ENSURE ENLIVE) Liqd Take 237 mLs by mouth 2 (two) times daily between meals.   ferrous fumarate-b12-vitamic C-folic acid capsule Commonly known as:  TRINSICON / FOLTRIN Take 1 capsule by mouth 2 (two) times daily after a meal.   gabapentin 600 MG tablet Commonly known as:  NEURONTIN Take 0.5 tablets (300 mg total) by mouth 2 (two) times daily.   HYDROcodone-acetaminophen 5-325 MG tablet Commonly known as:  NORCO/VICODIN Take 1 tablet by mouth every 6 (six) hours as needed for moderate pain.   Melatonin 10 MG Tabs Take 20 mg by mouth at bedtime.   memantine 10 MG tablet Commonly known as:  NAMENDA Take 10 mg by mouth 2 (two) times daily.   polyethylene glycol 17 g packet Commonly known as:  MIRALAX / GLYCOLAX Take 17 g by mouth daily as needed for mild constipation.   potassium chloride SA 20 MEQ tablet Commonly known as:  K-DUR Take 1 tablet (20 mEq total) by mouth daily.   QUEtiapine 25 MG tablet Commonly known as:  SEROQUEL Take 25 mg by mouth at bedtime.   senna 8.6 MG Tabs tablet Commonly known as:  SENOKOT Take 1 tablet (8.6 mg total) by mouth 2 (two) times daily.   traZODone 50 MG tablet Commonly known as:  DESYREL Take 0.5 tablets (25 mg total) by mouth at bedtime as needed for sleep.        DISCHARGE INSTRUCTIONS:      If you experience worsening of your admission symptoms, develop shortness of breath, life threatening emergency, suicidal or homicidal thoughts you must seek medical attention immediately by calling 911 or calling your MD immediately  if symptoms less severe.  You Must read complete instructions/literature along with all the possible adverse reactions/side effects for all the Medicines you take and that have been prescribed to you. Take any new Medicines after you have completely understood and accept all the possible adverse  reactions/side effects.   Please note  You were cared for by a hospitalist during your hospital stay. If you have any questions about your discharge medications or the care you received while you were in the hospital after you are discharged, you can call the unit and asked to speak with the hospitalist on call if the hospitalist that took care of you is not available. Once you are discharged, your primary care physician will handle any further medical issues. Please note that NO REFILLS for any discharge medications will be authorized once you are discharged, as it is imperative that you return to your primary care physician (or establish a relationship with a primary care physician if you do not have one) for your aftercare needs so that they can reassess your need for medications and monitor your lab values.    Today   CHIEF COMPLAINT:   Chief Complaint  Patient presents with  . Fall  . Hip Pain    HISTORY OF PRESENT ILLNESS:  78 y.o. female with a known history of hypertension, dementia and osteoporosis, who presented to the emergency room with acute onset of right hip pain after having a mechanical accidental fall when she lost her balance after her 55 pounds dog got behind her feet causing her to trip and fall onto the right side with subsequent right hip pain.  He denies any headache or dizziness or blurred vision, chest pain or dyspnea or palpitations.  No paresthesias or focal muscle weakness.  Upon presentation to the emergency room vital signs were within normal.  Labs were remarkable for a BUN of 26.  She had a right humerus and femur x-ray that came back negative for fracture but showed tricompartmental right knee osteoarthritis.  Portable chest ray showed no acute cardiopulmonary disease.  CT of the pelvis without contrast revealed acute undisplaced intertrochanteric right femoral fracture.  Dr. Ernest Pine was consulted about the patient and is aware.  The patient was given  fentanyl 25 mcg IV as well as 600 mg of IV clindamycin.  The patient will be admitted to a surgical bed for further evaluation and management.    VITAL SIGNS:  Blood pressure 115/64, pulse 99, temperature 99 F (37.2 C), temperature source Oral, resp. rate 18, height  (1.676 m), weight 51 kg, SpO2 97 %.  I/O:    Intake/Output Summary (Last 24 hours) at 05/22/2018 1019 Last data filed at 05/21/2018 2043 Gross per 24 hour  Intake 100 ml  Output 0 ml  Net 100 ml    PHYSICAL EXAMINATION:  GENERAL:  78 y.o.-year-old patient lying in the bed with no acute distress.  EYES: Pupils equal, round, reactive to light and accommodation. No scleral icterus. Extraocular muscles intact.  HEENT: Head atraumatic, normocephalic. Oropharynx and nasopharynx clear.  NECK:  Supple, no jugular venous distention. No thyroid enlargement, no tenderness.  LUNGS: Normal breath sounds bilaterally, no wheezing, rales,rhonchi or crepitation. No use of accessory muscles of respiration.  CARDIOVASCULAR: S1, S2 normal. No murmurs, rubs, or gallops.  ABDOMEN: Soft, non-tender, non-distended. Bowel sounds present. No organomegaly or mass.  EXTREMITIES: No pedal edema, cyanosis, or clubbing.  NEUROLOGIC: Cranial nerves II through XII are intact. Muscle strength 5/5 in all extremities. Sensation intact. Gait not checked.  PSYCHIATRIC: The patient is alert and oriented x 3.  SKIN: No obvious rash, lesion, or ulcer.   DATA REVIEW:   CBC Recent Labs  Lab 05/20/18 0451  WBC 10.5  HGB 11.7*  HCT 36.8  PLT 282    Chemistries  Recent Labs  Lab 05/20/18 0451  NA 138  K 3.8  CL 104  CO2 23  GLUCOSE 130*  BUN 25*  CREATININE 0.79  CALCIUM 9.0    Cardiac Enzymes No results for input(s): TROPONINI in the last 168 hours.  Microbiology Results  Results for orders placed or performed during the hospital encounter of 05/19/18  Surgical pcr screen     Status: None   Collection Time: 05/20/18  7:05 AM   Result Value Ref Range Status   MRSA, PCR NEGATIVE NEGATIVE Final   Staphylococcus aureus NEGATIVE NEGATIVE Final    Comment: (NOTE) The Xpert SA Assay (FDA approved for NASAL specimens in patients 65 years of age and older), is one component of a comprehensive surveillance program. It is not intended to diagnose infection nor to guide or monitor treatment. Performed at [facility], [address], [place], [place] [address]     RADIOLOGY:  Dg Hip Operative Lucienne Capers Or  W/o Pelvis Right  Result Date: 05/20/2018 CLINICAL DATA:  Intramedullary nail of right femur EXAM: OPERATIVE right HIP (WITH PELVIS IF PERFORMED) 7 VIEWS TECHNIQUE: Fluoroscopic spot image(s) were submitted for interpretation post-operatively. COMPARISON:  None. FINDINGS: Fluoroscopic images show placement of an intramedullary nail at the right femur with interlocking inter trochanteric screw. The previously present lag screws have been removed. IMPRESSION: Intraoperative fluoroscopy. Electronically Signed   By: Deatra Robinson M.D.   On: 05/20/2018 18:12   Dg Femur Port, Min 2 Views Right  Result Date: 05/20/2018 CLINICAL DATA:  Postoperative radiograph following right intramedullary femoral nail EXAM: RIGHT FEMUR PORTABLE 2 VIEW COMPARISON:  05/19/2018 FINDINGS: Status post removal of right femoral neck lag screws with placement of antegrade intramedullary nail with interlocking component along the femoral neck. There is no periprosthetic fracture. Alignment is normal. Expected postoperative gas and surgical drains. IMPRESSION: Expected postoperative appearance of antegrade intramedullary nail of the right femur. Electronically Signed   By: Deatra Robinson M.D.   On: 05/20/2018 18:15    EKG:   Orders placed or performed during the hospital encounter of 09/08/17  . EKG 12-Lead  . EKG 12-Lead      Management plans discussed with the patient, family and they are in agreement.  CODE STATUS:     Code Status  Orders  (From admission, onward)         Start     Ordered   05/20/18 0228  Full code  Continuous     05/20/18 0229        Code Status History    Date Active Date Inactive Code Status Order ID Comments User Context   09/09/2017 0024 09/11/2017 1357 Full Code [id number]  Cammy Copa, MD Inpatient   03/22/2017 2233 03/25/2017 1531 Full Code [id number]  Bertrum Sol, MD Inpatient   04/29/2015 1551 05/01/2015 1921 Full Code [id number]  Shaune Pollack, MD Inpatient    Advance Directive Documentation     Most Recent Value  Type of Advance Directive  Healthcare Power of Attorney, Living will  Pre-existing out of facility DNR order (yellow form or pink MOST form)  -  "MOST" Form in Place?  -      TOTAL TIME TAKING CARE OF THIS PATIENT: 40 minutes.    Evelena Asa Jeraldin Fesler M.D on 05/22/2018 at 10:19 AM  Between 7am to 6pm - Pager - [phone]  After 6pm go to [email or web address] - password [email or web address]  [facility]  Office  [phone]  CC: Primary care physician; Patient, No Pcp Per   Note: This dictation was prepared with Dragon dictation along with smaller phrase technology. Any transcriptional errors that result from this process are unintentional.

## 2018-05-22 NOTE — Progress Notes (Signed)
Physical Therapy Treatment Patient Details Name: Katie Fitzgerald MRN: [id number] DOB: Apr 27, 1940 Today's Date: 05/22/2018    History of Present Illness 78 y.o. female with a known history of hypertension, dementia and osteoporosis, who presented to the emergency room with acute onset of right hip pain after fall.  Pt has h/o multiple falls with multiple LE fx/surgical repairs.      PT Comments    Patient found asleep supine in bed with bed alarm on upon arrival. Patient required moderate encouragement to participate in therapy and remained pleasantly confused throughout the session. Patient was able to articulate that she had surgery on the R hip as the cause for her pain during the session. Patient very drowsy throughout session and was limited to seated and standing exercises with in room ambulation no greater than 10 feet before patient had limiting pain. Patient had standing tolerance with weight shift and activity > 5 minutes with max encouragement throughout. Patient will continue to benefit from skilled therapeutic intervention to address deficits in strength, mobility, safety, and overall function in order to decrease risk of falls and ensure safety.  Patient left in chair with alarm set, call bell with in reach, all needs met.    Follow Up Recommendations  SNF     Equipment Recommendations  None recommended by PT    Recommendations for Other Services       Precautions / Restrictions Precautions Precautions: Fall Restrictions Weight Bearing Restrictions: Yes RLE Weight Bearing: Weight bearing as tolerated    Mobility  Bed Mobility Overal bed mobility: Needs Assistance Bed Mobility: Supine to Sit     Supine to sit: Min assist     General bed mobility comments: Sequence cues throughout, with physical prompting  Transfers Overall transfer level: Needs assistance Equipment used: Rolling walker (2 wheeled) Transfers: Sit to/from Stand Sit to Stand: Min assist          General transfer comment: sequence/safety cues throughout  Ambulation/Gait Ambulation/Gait assistance: Mod assist Gait Distance (Feet): 10 Feet Assistive device: Rolling walker (2 wheeled) Gait Pattern/deviations: Decreased step length - left;Decreased stance time - right;Step-to pattern;Shuffle   Gait velocity interpretation: <1.31 ft/sec, indicative of household ambulator General Gait Details: Patient very confused and resistant to gait in room this morning. Patient hesitant to weight-shift for foot clearance on BLE and repeatedly pushed walker away without following, but was able to follow one-step directions with constant redirection to task and safety. Patient required moderate tactile cueing for foot clearance and stride BLE when turning. Patient neglects objects in her peripheral bilaterally when walking and needs assistance and max cueing for safety.   Stairs             Wheelchair Mobility    Modified Rankin (Stroke Patients Only)       Balance                                            Cognition                                              Exercises General Exercises - Lower Extremity Ankle Circles/Pumps: (pt inefficient in following instructions doing this) Quad Sets: AROM;Right;10 reps Long Arc Quad: AROM;Right;10 reps(inconsistent direction following) Heel Slides: Strengthening;Right;AROM;15 reps(resisted  leg extension) Hip ABduction/ADduction: Strengthening;AROM;10 reps;Right(gentle manual resistance for last 5 reps) Straight Leg Raises: Right;AAROM;5 reps Other Exercises Other Exercises: standing balance with RW, weight shift x10 BLE. Max VCs and Min A for safety. Other Exercises: sitting balance with SUE support, reaches x5 B to edge of BOS, no LOB, no posterior lean.  Other Exercises: standing marches with RW, x 5 BLE, Max VCs and Min A for safety. Mod tactile cues for foot clearance. Other Exercises:  functional transfer to chair with RW, max VCs for sequencing and safety.    General Comments        Pertinent Vitals/Pain Pain Assessment: Faces Faces Pain Scale: Hurts little more Pain Location: R hip Pain Descriptors / Indicators: Sore;Aching Pain Intervention(s): Limited activity within patient's tolerance;Monitored during session;Repositioned    Home Living Family/patient expects to be discharged to:: Skilled nursing facility Living Arrangements: Non-relatives/Friends             Additional Comments: Pt living in mother-in-law suite on daughter's property but apparently does have 24/7 assist?    Prior Function Level of Independence: Needs assistance  Gait / Transfers Assistance Needed: Pt was apparently able to ambulate with cane   Comments: Severe dementia, h/o many falls with fxs, etc   PT Goals (current goals can now be found in the care plan section)      Frequency    BID      PT Plan      Co-evaluation              AM-PAC PT "6 Clicks" Mobility   Outcome Measure  Help needed turning from your back to your side while in a flat bed without using bedrails?: A Little Help needed moving from lying on your back to sitting on the side of a flat bed without using bedrails?: A Little Help needed moving to and from a bed to a chair (including a wheelchair)?: A Lot Help needed standing up from a chair using your arms (e.g., wheelchair or bedside chair)?: A Little Help needed to walk in hospital room?: A Lot Help needed climbing 3-5 steps with a railing? : A Lot 6 Click Score: 15    End of Session Equipment Utilized During Treatment: Gait belt Activity Tolerance: Patient tolerated treatment well Patient left: in chair;with call bell/phone within reach;with chair alarm set   PT Visit Diagnosis: Muscle weakness (generalized) (M62.81);Difficulty in walking, not elsewhere classified (R26.2);Pain Pain - Right/Left: Right Pain - part of body: Hip      Time: 3750-5107 PT Time Calculation (min) (ACUTE ONLY): 30 min  Charges:  $Therapeutic Exercise: 8-22 mins $Therapeutic Activity: 23-37 mins                      Myles Gip PT, DPT [phone] 05/22/2018, 9:32 AM

## 2018-05-22 NOTE — Anesthesia Postprocedure Evaluation (Signed)
Anesthesia Post Note  Patient: Katie Fitzgerald  Procedure(s) Performed: INTRAMEDULLARY (IM) NAIL INTERTROCHANTRIC (Right )  Patient location during evaluation: PACU Anesthesia Type: Spinal and General Level of consciousness: awake and alert Pain management: pain level controlled Vital Signs Assessment: post-procedure vital signs reviewed and stable Respiratory status: spontaneous breathing, nonlabored ventilation and respiratory function stable Cardiovascular status: blood pressure returned to baseline and stable Postop Assessment: no apparent nausea or vomiting Anesthetic complications: no     Last Vitals:  Vitals:   05/21/18 1554 05/21/18 2334  BP: (!) 104/48 115/64  Pulse: 82 99  Resp:  18  Temp: 37 C 37.2 C  SpO2: 97% 97%    Last Pain:  Vitals:   05/21/18 2334  TempSrc: Oral  PainSc:                  Jovita Gamma

## 2018-05-25 DIAGNOSIS — F39 Unspecified mood [affective] disorder: Secondary | ICD-10-CM

## 2018-05-25 DIAGNOSIS — M810 Age-related osteoporosis without current pathological fracture: Secondary | ICD-10-CM

## 2018-05-25 DIAGNOSIS — G301 Alzheimer's disease with late onset: Secondary | ICD-10-CM

## 2018-05-25 DIAGNOSIS — S72001A Fracture of unspecified part of neck of right femur, initial encounter for closed fracture: Secondary | ICD-10-CM

## 2018-05-25 DIAGNOSIS — F0150 Vascular dementia without behavioral disturbance: Secondary | ICD-10-CM

## 2018-06-16 ENCOUNTER — Telehealth: Payer: Self-pay

## 2018-06-16 NOTE — Telephone Encounter (Signed)
Pt was seeing Dr Alphonsus Sias at [facility] and pt has discharged from [facility]; pt was sent home with medication and now pt needs refill of lorazepam; pt has a PCP so care giver will contact pts PCP about lorazepam refill.

## 2018-10-06 DEATH — deceased

## 2019-06-18 IMAGING — CR DG HIP (WITH PELVIS) OPERATIVE*L*
3 series · 3 of 3 positions shown · non-contrast
Comparison: Preoperative study September 08, 2017

CLINICAL DATA: Status post ORIF for a left hip fracture.

EXAM:
OPERATIVE left HIP (WITH PELVIS IF PERFORMED) 3 VIEWS
TECHNIQUE: Fluoroscopic spot image(s) were submitted for interpretation
post-operatively.

[m1]
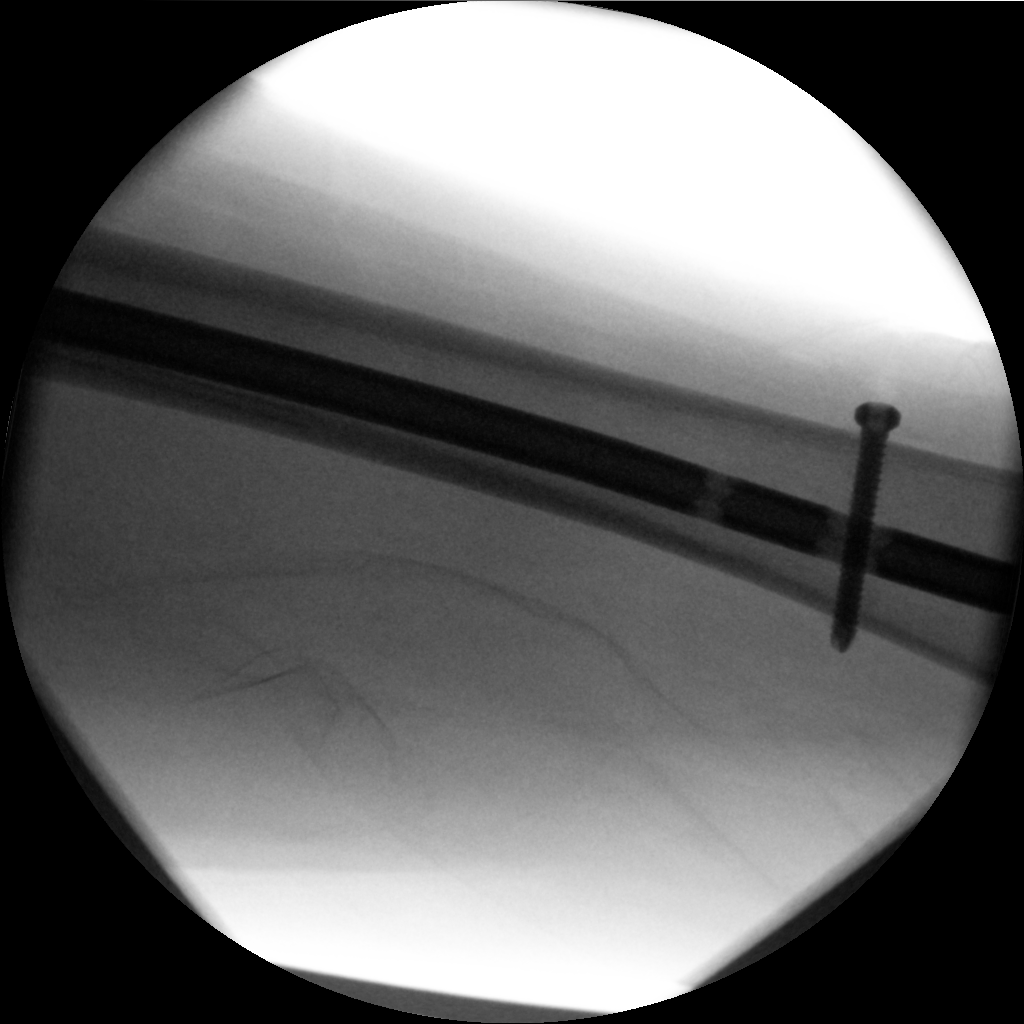

[m2]
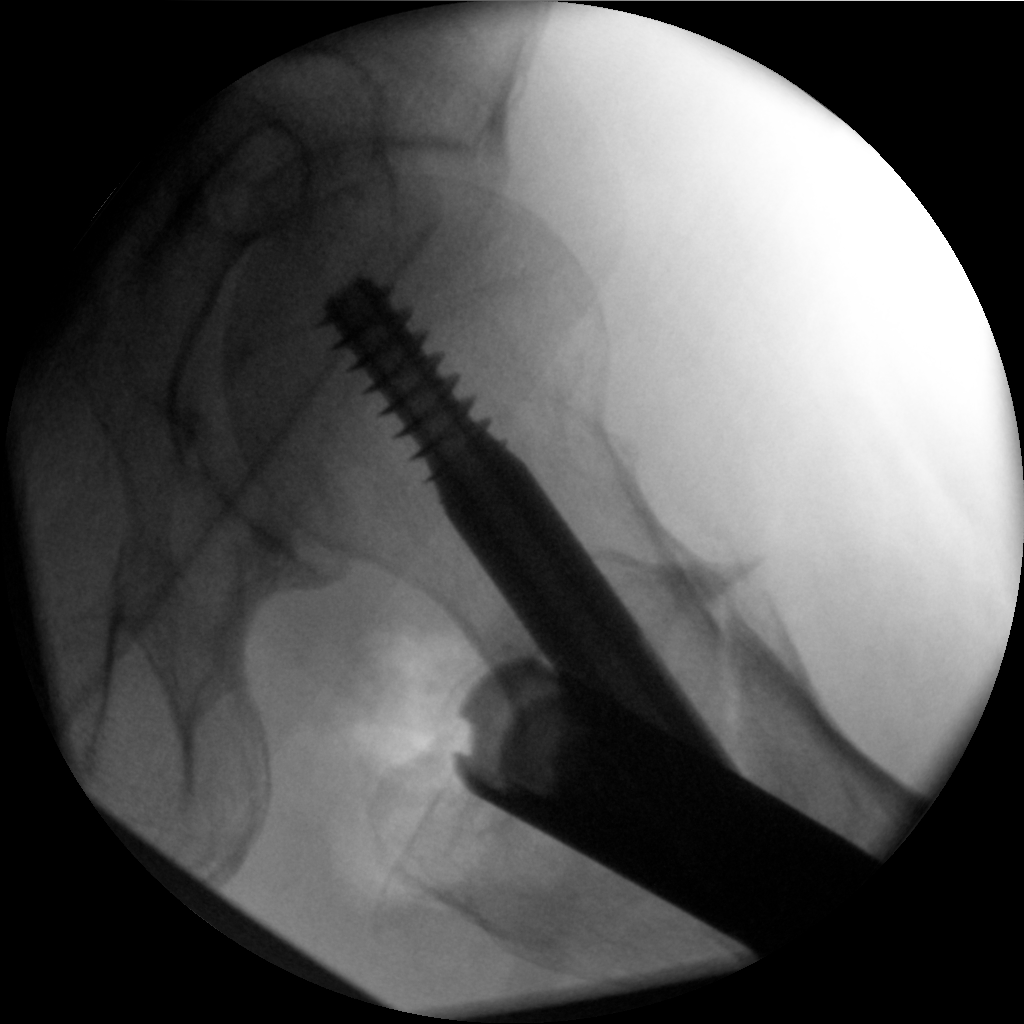

[m3]
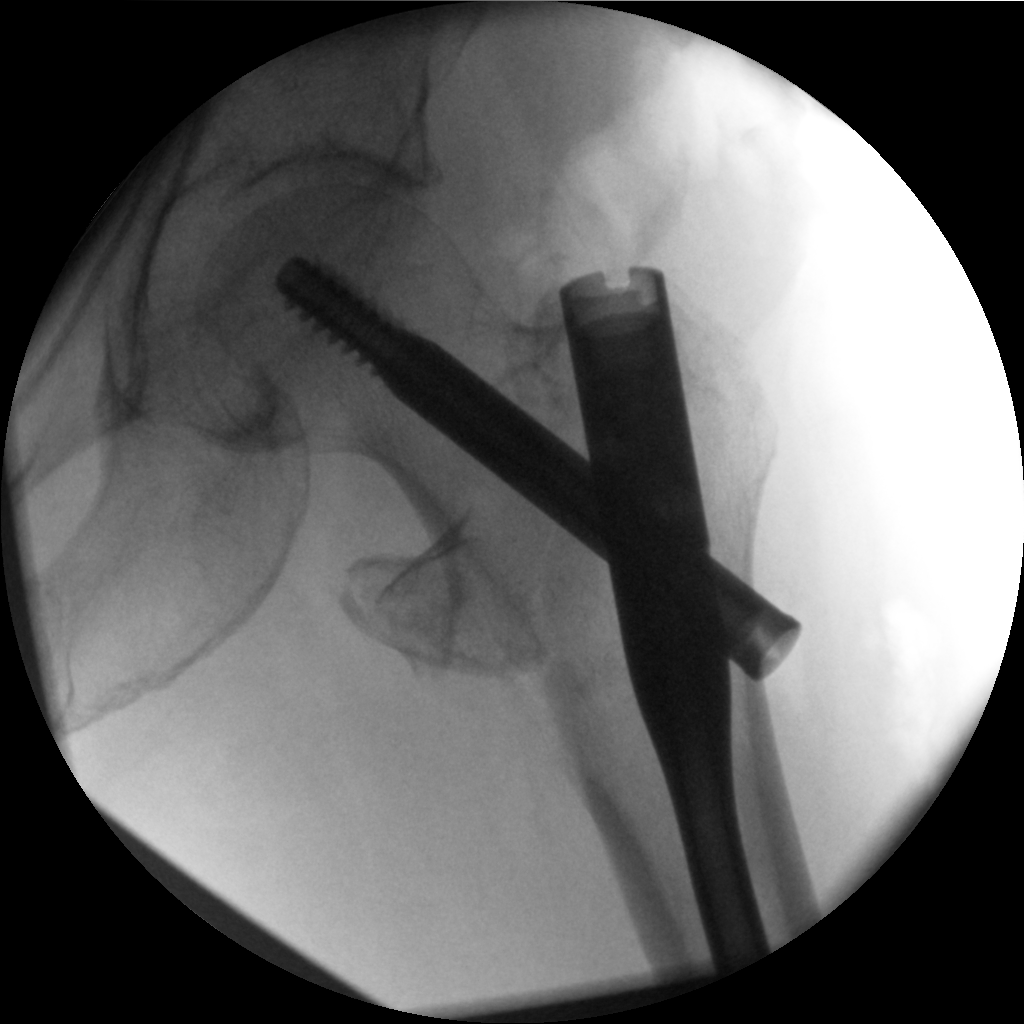

[3 of 3 positions shown; findings below may reference images not displayed]

FINDINGS: The patient has undergone ORIF for a comminuted intertrochanteric
fracture of the left hip. The intramedullary rod and telescoping
screw are in appropriate position. Alignment is near anatomic. There
is avulsion of the lesser trochanter.
IMPRESSION: No immediate complication following ORIF of an intertrochanteric
left femoral fracture.

## 2020-02-26 IMAGING — XA OPERATIVE RIGHT HIP WITH PELVIS
7 series · 7 of 7 positions shown · non-contrast
Comparison: None.

CLINICAL DATA: Intramedullary nail of right femur

EXAM:
OPERATIVE right HIP (WITH PELVIS IF PERFORMED) 7 VIEWS
TECHNIQUE: Fluoroscopic spot image(s) were submitted for interpretation
post-operatively.

[Series 11: cont. · 1 of 1 slices shown (1 of 7)]
[im 1/1]
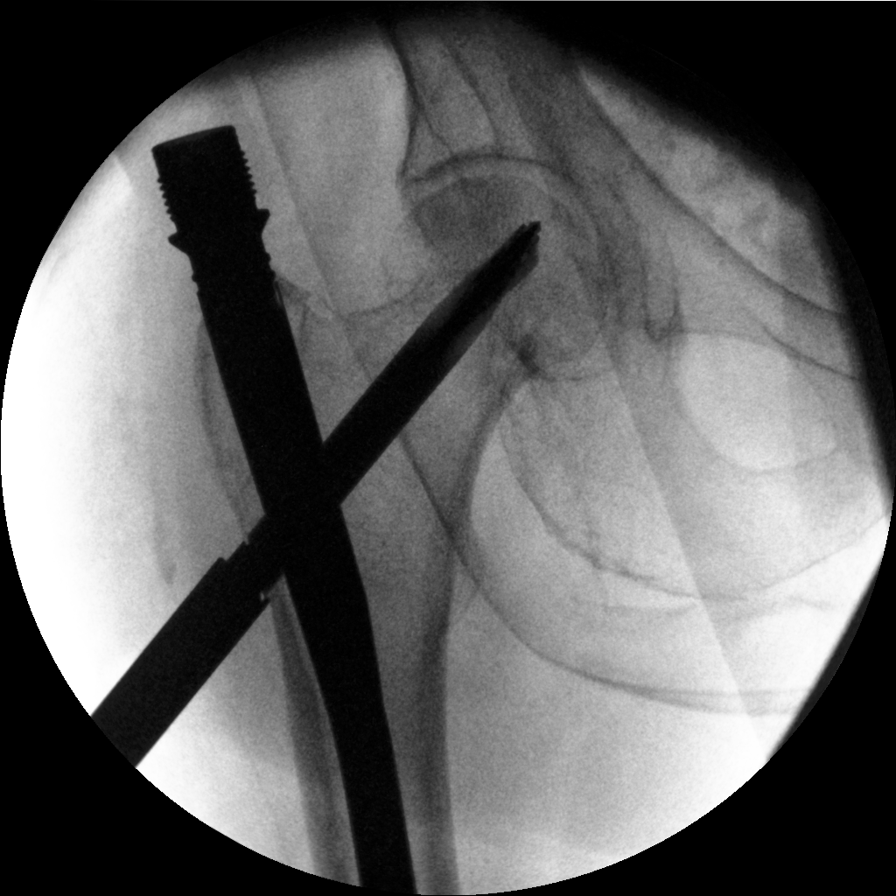

[Series 12: cont. · 1 of 1 slices shown (2 of 7)]
[im 1/1]
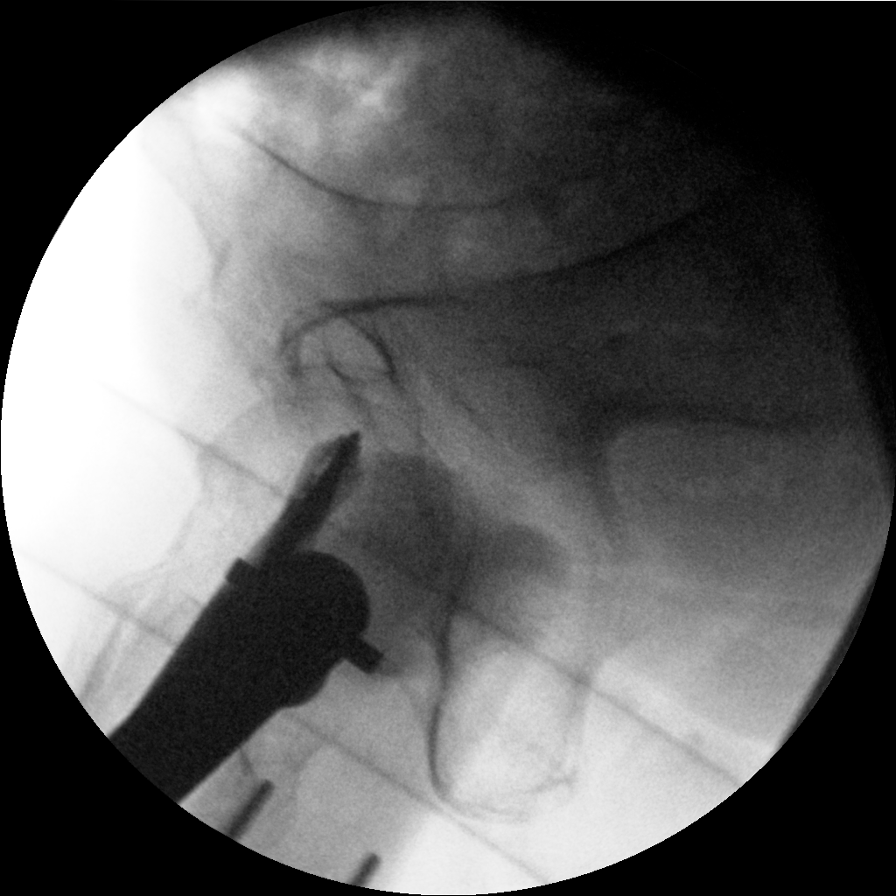

[Series 13: cont. · 1 of 1 slices shown (3 of 7)]
[im 1/1]
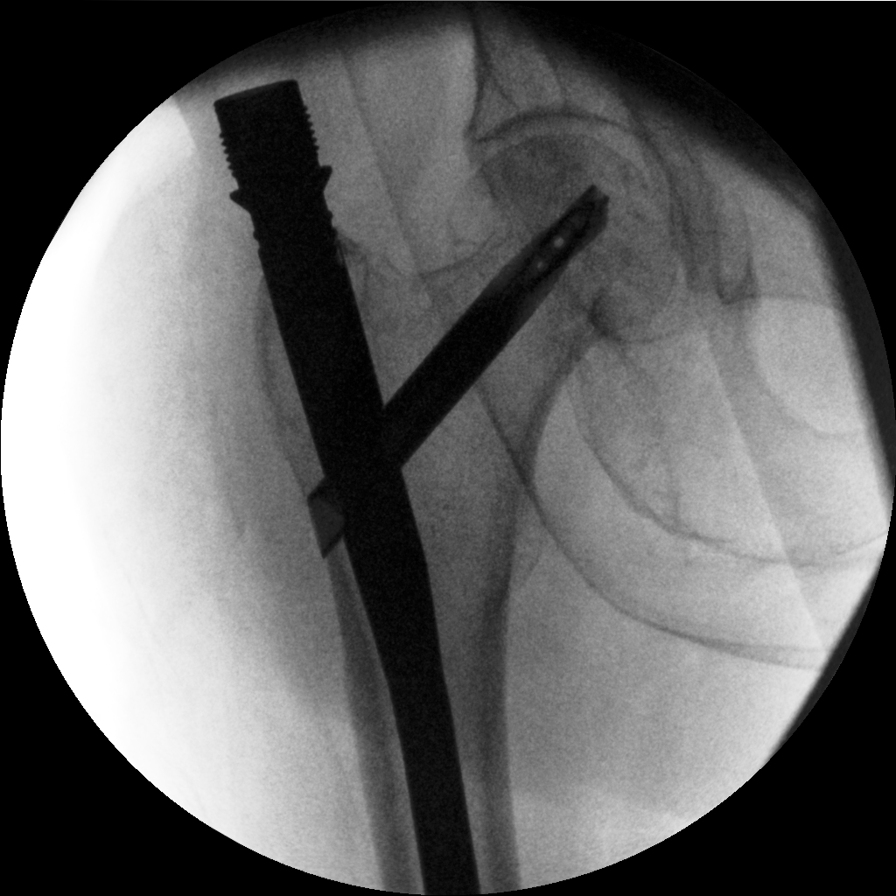

[Series 14: cont. · 1 of 1 slices shown (4 of 7)]
[im 1/1]
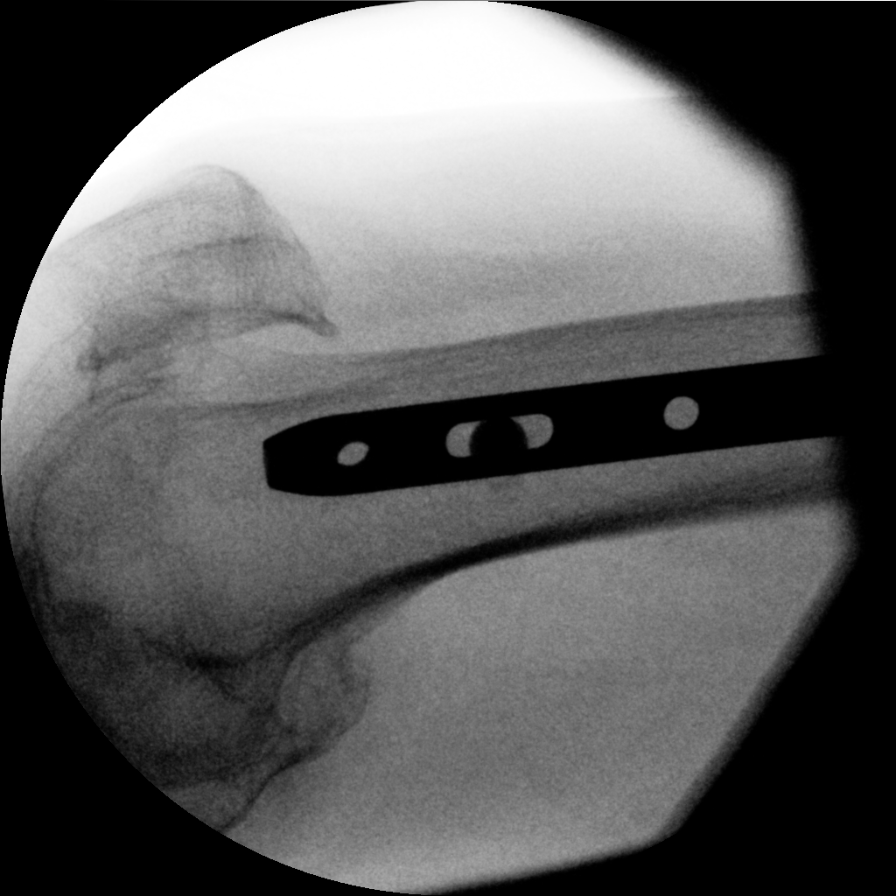

[Series 15: cont. · 1 of 1 slices shown (5 of 7)]
[im 1/1]
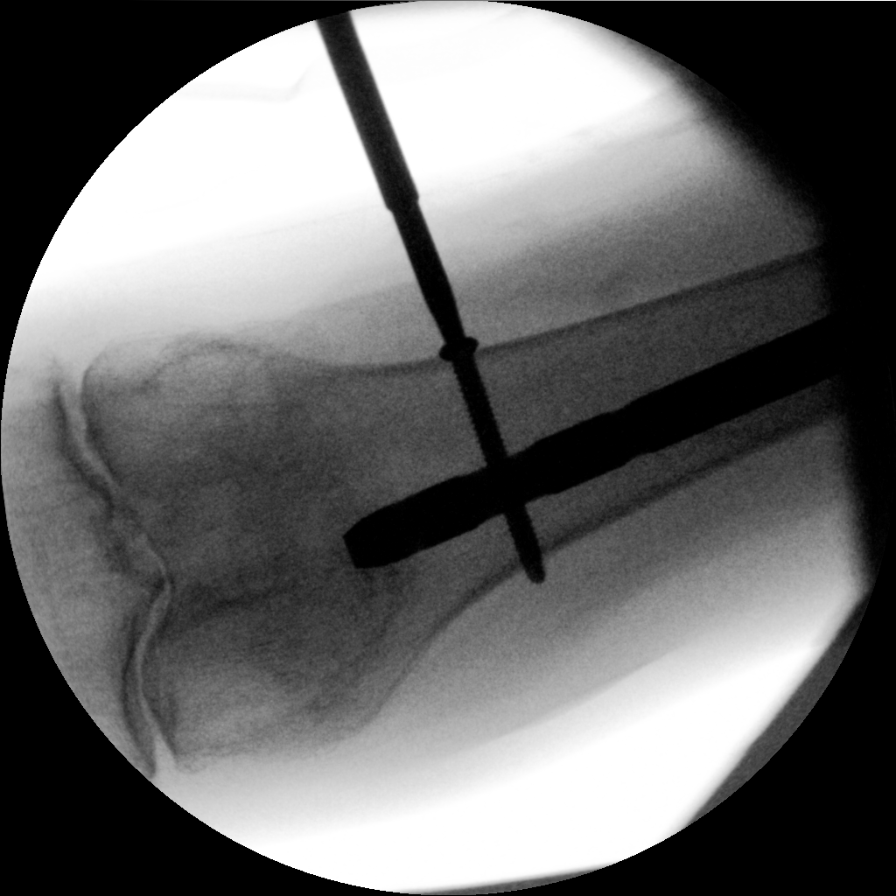

[Series 16: cont. · 1 of 1 slices shown (6 of 7)]
[im 1/1]
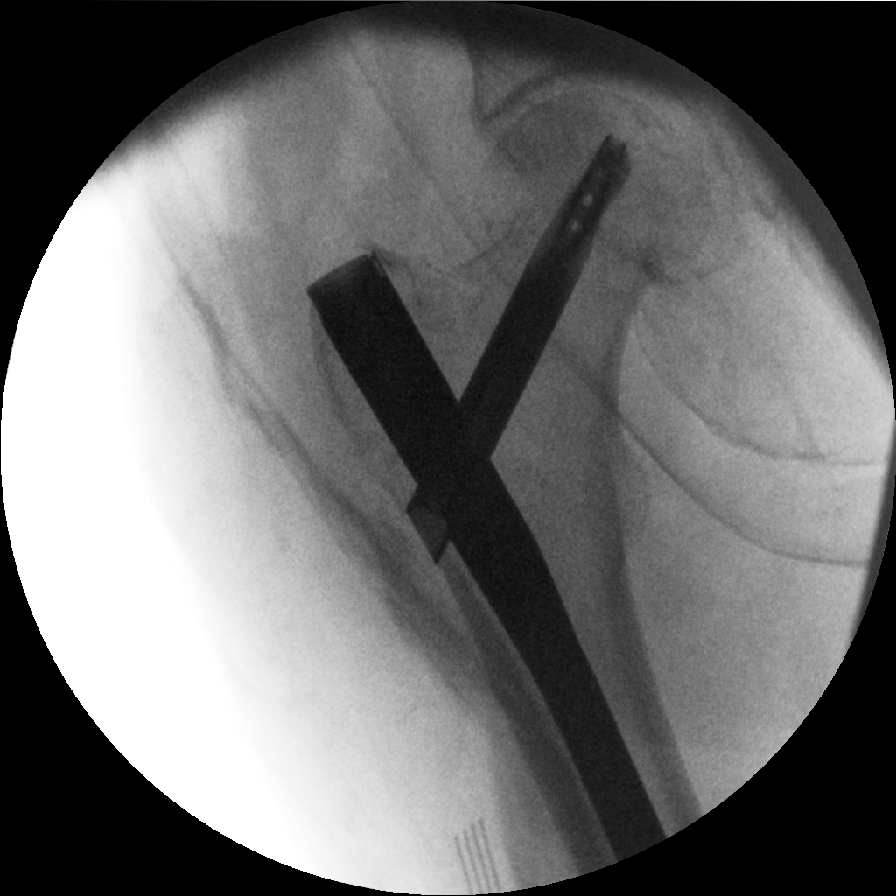

[Series 17: cont. · 1 of 1 slices shown (7 of 7)]
[im 1/1]
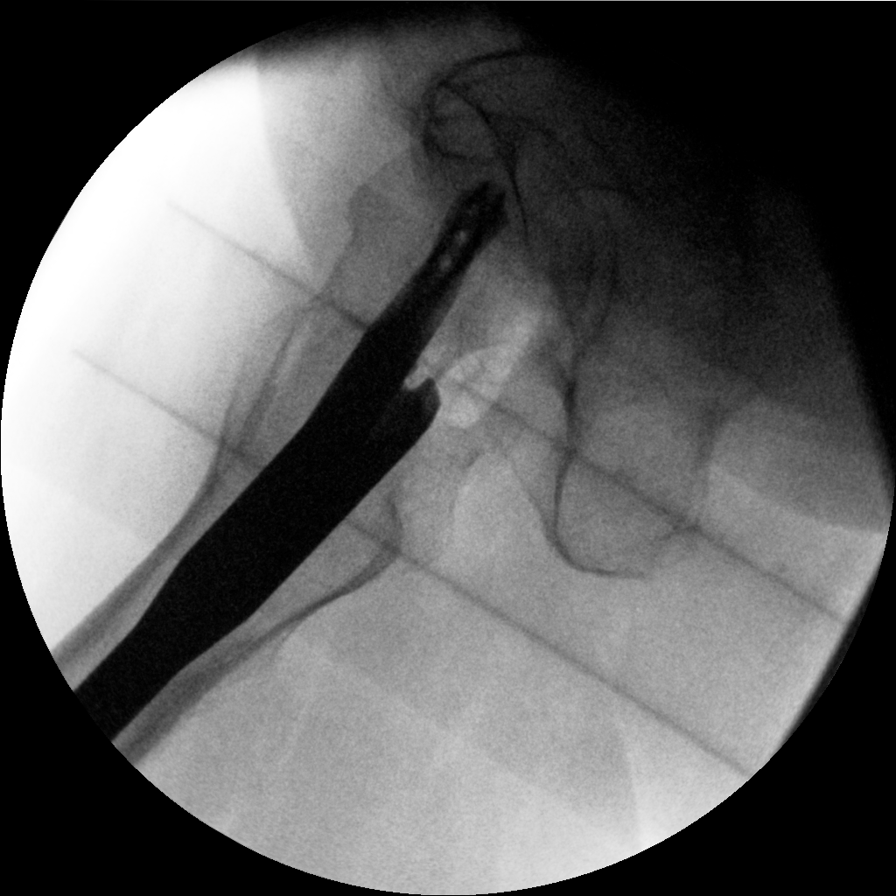

[7 of 7 positions shown; findings below may reference images not displayed]

FINDINGS: Fluoroscopic images show placement of an intramedullary nail at the
right femur with interlocking inter trochanteric screw. The
previously present lag screws have been removed.
IMPRESSION: Intraoperative fluoroscopy.
